# Patient Record
Sex: Female | Born: 1970 | Race: White | Hispanic: No | Smoking: Never smoker
Health system: Southern US, Community
[De-identification: ages and names within clinical notes are randomized; demographics above are authoritative.]

## PROBLEM LIST (undated history)

## (undated) DIAGNOSIS — E7800 Pure hypercholesterolemia, unspecified: Secondary | ICD-10-CM

## (undated) DIAGNOSIS — O24419 Gestational diabetes mellitus in pregnancy, unspecified control: Secondary | ICD-10-CM

## (undated) DIAGNOSIS — L4050 Arthropathic psoriasis, unspecified: Secondary | ICD-10-CM

## (undated) HISTORY — PX: WEIL OSTEOTOMY: SHX5044

## (undated) HISTORY — PX: ABDOMINAL HYSTERECTOMY: SHX81

## (undated) HISTORY — DX: Pure hypercholesterolemia, unspecified: E78.00

## (undated) HISTORY — DX: Arthropathic psoriasis, unspecified: L40.50

---

## 1997-12-24 ENCOUNTER — Emergency Department (HOSPITAL_COMMUNITY): Admission: EM | Admit: 1997-12-24 | Discharge: 1997-12-24 | Payer: Self-pay | Admitting: Emergency Medicine

## 1997-12-24 ENCOUNTER — Encounter: Payer: Self-pay | Admitting: Emergency Medicine

## 1999-05-27 ENCOUNTER — Inpatient Hospital Stay (HOSPITAL_COMMUNITY): Admission: AD | Admit: 1999-05-27 | Discharge: 1999-05-27 | Payer: Self-pay | Admitting: *Deleted

## 1999-07-23 ENCOUNTER — Encounter (HOSPITAL_COMMUNITY): Admission: RE | Admit: 1999-07-23 | Discharge: 1999-08-26 | Payer: Self-pay | Admitting: Obstetrics and Gynecology

## 1999-08-19 ENCOUNTER — Inpatient Hospital Stay (HOSPITAL_COMMUNITY): Admission: AD | Admit: 1999-08-19 | Discharge: 1999-08-19 | Payer: Self-pay | Admitting: *Deleted

## 1999-08-23 ENCOUNTER — Encounter (INDEPENDENT_AMBULATORY_CARE_PROVIDER_SITE_OTHER): Payer: Self-pay

## 1999-08-23 ENCOUNTER — Inpatient Hospital Stay (HOSPITAL_COMMUNITY): Admission: AD | Admit: 1999-08-23 | Discharge: 1999-08-25 | Payer: Self-pay | Admitting: Obstetrics & Gynecology

## 2000-07-30 ENCOUNTER — Inpatient Hospital Stay (HOSPITAL_COMMUNITY): Admission: AD | Admit: 2000-07-30 | Discharge: 2000-07-30 | Payer: Self-pay | Admitting: Obstetrics & Gynecology

## 2000-08-03 ENCOUNTER — Other Ambulatory Visit: Admission: RE | Admit: 2000-08-03 | Discharge: 2000-08-03 | Payer: Self-pay | Admitting: Obstetrics and Gynecology

## 2000-08-21 ENCOUNTER — Observation Stay (HOSPITAL_COMMUNITY): Admission: RE | Admit: 2000-08-21 | Discharge: 2000-08-23 | Payer: Self-pay | Admitting: Obstetrics and Gynecology

## 2000-08-21 ENCOUNTER — Encounter (INDEPENDENT_AMBULATORY_CARE_PROVIDER_SITE_OTHER): Payer: Self-pay

## 2001-05-25 ENCOUNTER — Inpatient Hospital Stay (HOSPITAL_COMMUNITY): Admission: AD | Admit: 2001-05-25 | Discharge: 2001-05-25 | Payer: Self-pay | Admitting: Obstetrics and Gynecology

## 2002-06-03 ENCOUNTER — Inpatient Hospital Stay (HOSPITAL_COMMUNITY): Admission: AD | Admit: 2002-06-03 | Discharge: 2002-06-03 | Payer: Self-pay | Admitting: Family Medicine

## 2003-06-30 ENCOUNTER — Emergency Department (HOSPITAL_COMMUNITY): Admission: EM | Admit: 2003-06-30 | Discharge: 2003-06-30 | Payer: Self-pay | Admitting: Emergency Medicine

## 2004-02-27 ENCOUNTER — Emergency Department (HOSPITAL_COMMUNITY): Admission: EM | Admit: 2004-02-27 | Discharge: 2004-02-27 | Payer: Self-pay | Admitting: Emergency Medicine

## 2004-06-14 ENCOUNTER — Emergency Department (HOSPITAL_COMMUNITY): Admission: EM | Admit: 2004-06-14 | Discharge: 2004-06-14 | Payer: Self-pay | Admitting: Emergency Medicine

## 2005-11-09 ENCOUNTER — Inpatient Hospital Stay (HOSPITAL_COMMUNITY): Admission: AD | Admit: 2005-11-09 | Discharge: 2005-11-09 | Payer: Self-pay | Admitting: Family Medicine

## 2007-09-10 ENCOUNTER — Emergency Department (HOSPITAL_COMMUNITY): Admission: EM | Admit: 2007-09-10 | Discharge: 2007-09-10 | Payer: Self-pay | Admitting: Emergency Medicine

## 2010-05-24 NOTE — Op Note (Signed)
[facility]  Patient:    Kathy Hunt, Kathy Hunt                       MRN: [id number] Proc. Date: 08/23/99 Adm. Date:  [id number] Disc. Date: [id number] Attending:  Leonard Schwartz                           Operative Report  PREOPERATIVE DIAGNOSIS:       Desire for surgical sterilization.  POSTOPERATIVE DIAGNOSIS:      Desire for surgical sterilization.  OPERATION:                    Postpartum tubal sterilization.  ANESTHESIA:                   Epidural which failed and then general orotracheal.  ESTIMATED BLOOD LOSS:         Less than 10 cc.  COMPLICATIONS:                Failed epidural.  FINDINGS:                     The tubes were normal for the postpartum stated.  DESCRIPTION OF PROCEDURE:     A discussion was held with the patient prior to her procedure concerning the indications for her procedure which is her desire for surgical sterilization and no further children as well as the risks involved which include but are not limited to anesthesia, bleeding, infection, damage to adjacent organs, and failed tubal sterilization resulting in subsequent pregnancy.  The patient wished to proceed.  She was taken to the operating room after appropriate identification and placed on the operating table.  Her labor epidural had been redosed in the preoperative area.  She was placed on the operating table in the supine position.  The abdomen was prepped with multiple layers of Betadine and draped as a sterile field.  The subumbilical area was infiltrated with 5 cc of quarter percent Marcaine. After a waiting period of approximately 20-25 minutes, there still was not adequate anesthesia for a surgical procedure, and the decision was made by the anesthesiologist to proceed with a general anesthetic.  This was done, and a subumbilical incision made.  The peritoneal cavity was then entered with a combination of blunt and sharp dissection and the left  fallopian tube identified, followed to imbriated end, then grasped at the isthmic portion.  A suture of 2-0 chromic was placed through the mesosalpinx and tied fore and aft on a knuckle of tube.  A second suture ligature was placed proximal to that and the intervening knuckle of tube excised.  The cut ends were cauterized and a similar procedure carried out on the opposite side.  Hemostasis was noted to be adequate.  The abdominal peritoneum was closed with a pursestring suture of 0 Vicryl.  The fascia was closed with a running suture of 0 Vicryl.  A single suture of 0 Vicryl was placed in the subcutaneous tissue, and a subcuticular suture of 3-0 Vicryl was used to close the skin incision.  A sterile dressing was applied and the patient awakened from general anesthesia and then taken to the recovery room in satisfactory condition having tolerated the procedure well with sponge and instrument counts correct. DD:  08/23/99 TD:  08/25/99 Job: [id number]

## 2010-05-24 NOTE — Discharge Summary (Signed)
[facility]  Patient:    Kathy Hunt, Kathy Hunt Visit Number: [id number] MRN: [id number]          Service Type: GYN Location: [id number] Attending Physician:  Wandalee Ferdinand Dictated by:   Rudy Jew Ashley Royalty, M.D. Admit Date:  08/21/2000 Discharge Date: 08/23/2000                             Discharge Summary  DISCHARGE DIAGNOSIS:          Uterine prolapse.  OPERATIONS AND SPECIAL PROCEDURES:           1. Total vaginal hysterectomy.                               2. Posterior colporrhaphy.  CONSULTATIONS:                None.  DISCHARGE MEDICATIONS:        1. Chromagen one p.o. q.d.                               2. Darvocet-N 100 one p.o. q.3-4h. p.r.n. pain.                               3. Peri-Colace one to two q.d.  HISTORY AND PHYSICAL:         This is a 40 year old, G4, P3, abortus 1, who presented to Dr. Sylvester Harder for evaluation of possible prolapsed uterus. She noted on or about July 24 an abrupt drop in her uterus. Examination in the office revealed a second degree cystocele and first degree rectocele. The overall uterine prolapse, however, was a third degree and quite debilitating to her. In the course of the workup, she was also noted to have a right adnexal cyst on ultrasound. For the remainder of the history and physical, please see chart.  HOSPITAL COURSE:              The patient was admitted to [facility] on August 21, 2000. On that same date, she was taken to the operating room and underwent total vaginal hysterectomy as well as posterior colporrhaphy. The procedure was uncomplicated. Postoperative course was similarly uncomplicated. The patient was discharged on the second postoperative day afebrile and in satisfactory condition.  ACCESSORY CLINICAL FINDINGS:  Pathology report revealed nonspecific chronic cervicitis, degenerating secretory endometrium, myometrium with no pathologic abnormalities,  benign paratubal cyst. CBC was obtained on admission and revealed hemoglobin of 13.6, hematocrit 38.4. Repeat values were obtained on August 22, 2000 and revealed values of 9.5 and 26.6, respectively. Final CBC was obtained August 23, 2000 and revealed values of 9.2 and 26.3, respectively. Comprehensive metabolic panel was obtained and was essentially within normal limits. Type and Rh revealed B negative blood.  DISPOSITION:                  The patient is to return to [facility] in four to six weeks for postoperative evaluation. She is also to return in two to three days postdischarge for preliminary evaluation as well. Dictated by:   Rudy Jew Ashley Royalty, M.D. Attending Physician:  Wandalee Ferdinand DD:  09/10/00 TD:  09/10/00 Job: [id number]

## 2010-05-24 NOTE — H&P (Signed)
[facility]  Patient:    Kathy Hunt, Kathy Hunt                       MRN: [id number] Adm. Date:  [id number] Disc. Date: [id number] Attending:  Antionette Char                         History and Physical  HISTORY OF PRESENT ILLNESS:   This is a 40 year old gravida 4, para 3, abortus 1, who presented to me August 03, 2000, for evaluation of a possible prolapsed uterus.  She stated she noted on or about July 29, 2000, an abrupt drop in her uterus.  She asked her husband to check and stated he said it appeared abnormal.  She, hence, arranged to come to the office.  She denies any stress urinary incontinence.  She denied having to use any digital pressure to effect a bowel movement.  She is status post BTSP.  Also is status post vaginal delivery x 3.  The patient states her symptoms are sufficiently debilitating to warrant surgery.  MEDICATIONS:                  None.  PAST MEDICAL HISTORY:         Eczema.  PAST SURGICAL HISTORY:        1. D&C for SAB.                               2. BTSP.                               3. Vaginal delivery x 3.  ALLERGIES:                    PENICILLIN - RASH.  FAMILY HISTORY:               Hypertension and diabetes.  SOCIAL HISTORY:               The patient denies use of tobacco or significant alcohol.  REVIEW OF SYSTEMS:            Noncontributory.  PHYSICAL EXAMINATION:  GENERAL:                      Well-developed, well-nourished, pleasant white female in no acute distress.  VITAL SIGNS:                  Afebrile, vital signs stable.  SKIN:                         Warm and dry, without lesions.  LYMPH:                        No supraclavicular, cervical, or inguinal adenopathy.  HEENT:                        Normocephalic.  NECK:                         Supple.  Without thyromegaly.  CHEST:                        Clear.  LUNGS:  Clear.  CARDIAC:                      Regular  rate and rhythm.  Without murmurs, gallops, or rubs.  BREASTS:                      Deferred.  ABDOMEN:                      Soft and nontender.  Without masses or organomegaly.  Bowel sounds are active.  MUSCULOSKELETAL:              Full range of motion.  Without edema, cyanosis, or CVA tenderness.  PELVIC:                       External genitalia within normal limits.  Vagina and cervix without gross lesions.  Bimanual examination reveals the uterus to be approximately 10 x 6 x 5 cm.  It is retroverted.  She is noted to have an approximately third-degree prolapse.  There is a second-degree cystocele and a first-degree rectocele.  However, the uterine descensus appears to be out of proportion to the vaginal prolapse.  LABORATORY DATA:              Ultrasound on August 05, 2000, reveals normal-sized uterus with an arcuate pattern of the fundus.  In the right adnexa there is a 1.6 cm irregularly shaped cyst.  The left adnexa is unremarkable.  IMPRESSION:                   1. Uterine prolapse, third-degree, symptomatic                                  and debilitating.                               2. Right adnexal cyst versus cystic follicle.                               3. Second-degree cystocele and first-degree                                  rectocele.  PLAN:                         Total vaginal hysterectomy.  The possibility of anterior or posterior colporrhaphy discussed and accepted.  I told the patient I feel both are unlikely, but the posterior colporrhaphy is particularly unlikely.  The risks, benefits, and alternatives were thoroughly discussed with the patient.  She states she understands and accepts.  Questions invited and answered. DD:  08/20/00 TD:  08/20/00 Job: [id number]

## 2010-05-24 NOTE — Op Note (Signed)
[facility]  Patient:    Kathy Hunt, Kathy Hunt Visit Number: [id number] MRN: [id number]          Service Type: Attending:  Rudy Jew. Ashley Royalty, M.D. Dictated by:   Rudy Jew Ashley Royalty, M.D. Proc. Date: 08/21/00 Adm. Date:  08/23/00                             Operative Report  PREOPERATIVE DIAGNOSES:       1. Uterine prolapse.                               2. Second degree cystocele.                               3. First degree rectocele.  POSTOPERATIVE DIAGNOSES:      1. Uterine prolapse.                               2. Second degree cystocele.                               3. First degree rectocele.  OPERATION:                    1. Total vaginal hysterectomy.                               2. Posterior colporrhaphy.  SURGEON:                      Rudy Jew. Ashley Royalty, M.D.  ASSISTANT:                    Marcelle Overlie, M.D.  ANESTHESIA:                   General.  ESTIMATED BLOOD LOSS:         300 cc.  COMPLICATIONS:                None.  PACKS AND DRAINS:             Foley, Iodoform vaginal pack.  DESCRIPTION OF PROCEDURE:     The patient was taken to the operating room and placed in the dorsal supine position.  After adequate general anesthesia was administered, she was placed in the lithotomy position and prepped and draped in the usual manner for vaginal surgery.  Posterior weighted retractor was placed per vagina, and the apex of the vagina was grasped with a Jacobs tenaculum.  Approximately 20 cc of 1% Xylocaine with 1:100,000 epinephrine was instilled into the cervical tissues to aid in hemostasis.  Foley catheter was placed.  An incision was made from 3 to 9 oclock in the cervical mucosa with a #15 blade.  A posterior colpotomy incision was made, and Steiner-Auvard retractor was placed into the abdominal cavity.  The uterosacral ligaments were bilaterally clamped, cut, and secured with a #1 chromic catgut.  The pedicles were held.  A  second bite was taken on either side in order to accomplish further decensus.  Next, the anterior cul-de-sac was entered uneventfully.  The uterine vessels were clamped, cut, and doubly secured  with a #1 chromic catgut bilaterally.  An additional bite was taken on either side cephalad to the uterine vessels.  The pedicles were clamped, cut, and secured with a #1 chromic catgut.   Next, a single-tooth tenaculum was used to delivery the corpus posteriorly.  A final pedicle consisting of the round ligament, fallopian tube, and utero-ovarian ligament was isolated on either side.  The pedicles were cut, clamped, and doubly secured with #1 chromic catgut.  The pedicles were held for later inspection.  Copious irrigation was accomplished.  Hemostasis was noted.  Next, a running locking suture of 0 Vicryl was placed from approximately 2 oclock to 10 oclock on the posterior vaginal cuff using a running locking technique.  Next, a modified McCalls suture of 2-0 Vicryl was placed in the posterior peritoneum just cephalad to the vaginal cuff in order to diminish the chance of enterocele formation and to aid in plication of the uterosacral ligaments up high.  Next, the vaginal mucosa was closed anteriorly to posteriorly using #1 chromic catgut.  The vaginal hysterectomy portion of the procedure was then terminated.  Attention was then turned to the posterior colporrhaphy.  A transverse incision was made in the posterior vagina at the level of the introitus.  The vaginal mucosa was incised longitudinally toward the apex.  The vaginal mucosa was dissected free from the underlying rectal fascia using sharp and blunt dissection.  The redundant rectal tissues were imbricated using 2-0 Vicryl suture.  The excess vaginal mucosa was excised.  The vaginal edges were then reapproximated using 2-0 Vicryl in a running locking fashion.  This seemed to completely repair the rectocele.  Interestingly, after the  hysterectomy was completed, the patient had very little cystocele present, and no repair was felt warranted.  Iodoform vaginal pack was placed and the procedure terminated.  The patient tolerated the procedure extremely well and was returned to the recovery room in good condition. Dictated by:   Rudy Jew Ashley Royalty, M.D. Attending:  Rudy Jew Ashley Royalty, M.D. DD:  08/26/00 TD:  08/27/00 Job: [id number]

## 2010-10-09 LAB — GLUCOSE, CAPILLARY: Glucose-Capillary: 76

## 2010-10-09 LAB — CBC
Platelets: 256
RDW: 12.2

## 2010-10-09 LAB — POCT I-STAT, CHEM 8
Calcium, Ion: 1.14
Glucose, Bld: 89
HCT: 37
Hemoglobin: 12.6

## 2014-01-13 ENCOUNTER — Emergency Department (HOSPITAL_COMMUNITY)
Admission: EM | Admit: 2014-01-13 | Discharge: 2014-01-13 | Disposition: A | Discharge status: Home / Self Care | Payer: Self-pay | Attending: Emergency Medicine | Admitting: Emergency Medicine

## 2014-01-13 ENCOUNTER — Emergency Department (HOSPITAL_COMMUNITY): Payer: Self-pay

## 2014-01-13 ENCOUNTER — Encounter (HOSPITAL_COMMUNITY): Payer: Self-pay | Admitting: *Deleted

## 2014-01-13 DIAGNOSIS — S20212A Contusion of left front wall of thorax, initial encounter: Secondary | ICD-10-CM | POA: Insufficient documentation

## 2014-01-13 DIAGNOSIS — Y998 Other external cause status: Secondary | ICD-10-CM | POA: Insufficient documentation

## 2014-01-13 DIAGNOSIS — S4392XA Sprain of unspecified parts of left shoulder girdle, initial encounter: Secondary | ICD-10-CM | POA: Insufficient documentation

## 2014-01-13 DIAGNOSIS — S43402A Unspecified sprain of left shoulder joint, initial encounter: Secondary | ICD-10-CM

## 2014-01-13 DIAGNOSIS — Y92410 Unspecified street and highway as the place of occurrence of the external cause: Secondary | ICD-10-CM | POA: Insufficient documentation

## 2014-01-13 DIAGNOSIS — Z8632 Personal history of gestational diabetes: Secondary | ICD-10-CM | POA: Insufficient documentation

## 2014-01-13 DIAGNOSIS — Y9389 Activity, other specified: Secondary | ICD-10-CM | POA: Insufficient documentation

## 2014-01-13 DIAGNOSIS — Z880 Allergy status to penicillin: Secondary | ICD-10-CM | POA: Insufficient documentation

## 2014-01-13 HISTORY — DX: Gestational diabetes mellitus in pregnancy, unspecified control: O24.419

## 2014-01-13 MED ORDER — OXYCODONE-ACETAMINOPHEN 5-325 MG PO TABS: ORAL_TABLET | ORAL | Status: DC

## 2014-01-13 MED ORDER — ONDANSETRON 4 MG PO TBDP
4.0000 mg | ORAL_TABLET | Freq: Once | ORAL | Status: AC
  Administered 2014-01-13: 4 mg via ORAL
  Filled 2014-01-13: qty 1

## 2014-01-13 MED ORDER — OXYCODONE-ACETAMINOPHEN 5-325 MG PO TABS
1.0000 | ORAL_TABLET | Freq: Once | ORAL | Status: AC
  Administered 2014-01-13: 1 via ORAL
  Filled 2014-01-13: qty 1

## 2014-01-13 NOTE — ED Notes (Signed)
L arm sling applied to L shoulder

## 2014-01-13 NOTE — Discharge Instructions (Signed)
Take percocet for breakthrough pain, do not drink alcohol, drive, care for children or do other critical tasks while taking percocet.  Only use the arm sling for up to 2 days. Take the arm out and rotate the shoulder every 4 hours.   It is very important that you take deep breaths to prevent lung collapse and infection.  Take 10 deep breaths every hour to prevent lung collapse.  If you develop cough, fever or shortness of breath return immediately to the emergency room.  Do not hesitate to return to the emergency room for any new, worsening or concerning symptoms.  Please obtain primary care using resource guide below. But the minute you were seen in the emergency room and that they will need to obtain records for further outpatient management.    Emergency Department Resource Guide 1) Find a Doctor and Pay Out of Pocket Although you won't have to find out who is covered by your insurance plan, it is a good idea to ask around and get recommendations. You will then need to call the office and see if the doctor you have chosen will accept you as a new patient and what types of options they offer for patients who are self-pay. Some doctors offer discounts or will set up payment plans for their patients who do not have insurance, but you will need to ask so you aren't surprised when you get to your appointment.  2) Contact Your Local Health Department Not all health departments have doctors that can see patients for sick visits, but many do, so it is worth a call to see if yours does. If you don't know where your local health department is, you can check in your phone book. The CDC also has a tool to help you locate your state's health department, and many state websites also have listings of all of their local health departments.  3) Find a [facility] If your illness is not likely to be very severe or complicated, you may want to try a walk in clinic. These are popping up all over the  country in pharmacies, drugstores, and shopping centers. They're usually staffed by nurse practitioners or physician assistants that have been trained to treat common illnesses and complaints. They're usually fairly quick and inexpensive. However, if you have serious medical issues or chronic medical problems, these are probably not your best option.  No Primary Care Doctor: - Call Health Connect at  [phone] - they can help you locate a primary care doctor that  accepts your insurance, provides certain services, etc. - Physician Referral Service- [phone]  Chronic Pain Problems: Organization         Address  Phone   Notes  [facility]  [phone] Patients need to be referred by their primary care doctor.   Medication Assistance: Organization         Address  Phone   Notes  [facility] Medication [facility] [address] [place], [place] [address] [phone] --Must be a resident of [facility] -- Must have NO insurance coverage whatsoever (no Medicaid/ Medicare, etc.) -- The pt. MUST have a primary care doctor that directs their care regularly and follows them in the community   Med[facility]ist  [phone]   [employer]  [phone]    Agencies that provide inexpensive medical care: Organization         Address  Phone   Notes  [place]  (  [phone]   [facility]    [phone]   [facility] [place], [place] [address] [phone]   [place] [address], [place] [phone]   Planned Parenthood    [phone]   [facility]    [phone]   [place] and [address], [place] Phone:  [phone], Fax:  [phone] Hours of Operation:  9 am - 6 pm, M-F.  Also accepts Medicaid/Medicare and self-pay.  [facility] for [place] [address], [address], [place] Phone: [phone], Fax: [phone]. Hours of Operation:  8:30 am - 5:30 pm, M-F.  Also accepts Medicaid and self-pay.  [facility] [place] [address] [place] Phone: [phone]   [place], [place], [place] [phone], Ext. [phone] Mondays & Thursdays: 7-9 AM.  First 15 patients are seen on a first come, first serve basis.    [place] Providers:  Organization         Address  Phone   Notes  [facility] [address] [place], [place] [phone] Also accepts self-pay patients.  [facility] [address], [place]  [phone]   [place], [address], [place] [phone]   [facility] [address] [place] [phone]   Lucianne Lei [address], [place]   [phone] Only accepts [place] Access [place] patients after they have their name applied to their card.   Self-Pay (no insurance) in [facility]:  Organization         Address  Phone   Notes  Sickle Cell Patients, [facility] [place] [phone]   [facility] [place] [phone]   [facility] [place]  [place], [place], [place] [phone]   [facility]/Dr. Osei-Bonsu  [address], [place] or [address], [place] [phone] Phone number for both [place] and [place] locations is the same.  [facility] [address], [place] [phone]   [facility] [place] [address] [place] or [address] [phone]   [facility] [address] [place] [phone], phone; [phone], fax Sees patients 1st and 3rd Saturday of every month.  Must  not qualify for public or private insurance (i.e. Medicaid, Medicare, [place] Health Choice, Veterans' Benefits)  Household income should be no more than 200% of the poverty level The clinic cannot treat you if you are pregnant or think you are pregnant  Sexually transmitted diseases are not treated at the clinic.    Dental Care: Organization         Address  Phone  Notes  [facility] Department of [facility] [place] [phone] Accepts children up to age 78 who are enrolled in [place] or [place]; pregnant women with a Medicaid card; and children who have applied for Medicaid or [place] Health Choice, but were declined, whose parents can pay a reduced fee at time of service.  [place]  [address], [employer] [phone] Accepts children up to age 34 who are enrolled in Medicaid or [place]; pregnant women with a Medicaid card; and children who have applied for Medicaid or  Health Choice, but were declined, whose parents can pay a reduced fee at time of service.  [facility] Access [facility]  University [phone] Patients are seen by appointment only. Walk-ins are not accepted. [place] will see patients 52 years of age and older. Monday - Tuesday (8am-5pm) Most Wednesdays (8:30-5pm) $30 per visit, cash only  [facility] Adult [facility] Access PROGRAM  [address], [facility] [phone] Patients are seen by appointment only. Walk-ins are not accepted. [place] will see patients 15 years of age and older. One Wednesday Evening (Monthly: Volunteer Based).  $30 per visit, cash only  [place]  [phone] for adults; Children under age 68, call Graduate Pediatric Dentistry at [phone]. Children aged 14-14, please call [phone] to request a pediatric application.  Dental services are  provided in all areas of dental care including fillings, crowns and bridges, complete and partial dentures, implants, gum treatment, root canals, and extractions. Preventive care is also provided. Treatment is provided to both adults and children. Patients are selected via a lottery and there is often a waiting list.   [facility] [address] [place]  [phone] [email or web address]   [facility] [address] [place], [place] [phone], Ext. [phone] Second and Fourth Thursday of each month, opens at 6:30 AM; Clinic ends at 9 AM.  Patients are seen on a first-come first-served basis, and a limited number are seen during each clinic.   [facility]  [address] [place], [place] [phone]   Eligibility Requirements You must have lived in [place], [place], or [place] counties for at least the last three months.   You cannot be eligible for state or federal sponsored [employer], including [employer], [place], or [employer].   You generally cannot be eligible for healthcare insurance through your employer.    How to apply: Eligibility screenings are held every Tuesday and Wednesday afternoon from 1:00 pm until 4:00 pm. You do not need an appointment for the interview!  [facility] [address], [place], [place]   [place]  [place] Department  [place]  [phone]    Behavioral Health Resources in the Community: Intensive Outpatient Programs Organization         Address  Phone  Notes  [place]. [address] [place], [place] [phone]   [facility] Outpatient [address], [place], [place]   ADS: Alcohol & Drug Svcs [address], [place], [place]   [place] [address]  [place], [place] or [phone]   Substance Abuse Resources Organization         Address  Phone  Notes  Alcohol and Drug Services  [phone]   [place]  [phone]   [facility]   Chinita Pester  [phone]   Residential & Outpatient Substance Abuse Program  [phone]   Psychological Services Organization         Address  Phone  Notes  [place]  [phone]  [place] [address] [place] or [phone]    Mobile Crisis Teams Organization         Address  Phone  Notes  [facility], Mobile Crisis Care Unit  [phone]   [facility]  [address] [place], [place]   [place] [address] [place] [phone]    Self-Help/Support Groups Organization         Address  Phone             Notes  [place]. of [place] - variety of support groups  [place] Call for more information  Narcotics Anonymous (NA), [facility] [address], [employer] [place]  2 meetings at this location   Special educational needs teacher         Address  Phone  Notes  [facility]idential [facility] [place],    [place]  [phone]   [facility]  [address], [place] [address]2831, [place], [place]   [place], [place] [phone] Admissions: 8am-3pm M-F  [facility] [address]    [place], [place] [phone]   [facility] [address] [place], [place], [place]   [facility] [address]  [place], [place]   [facility] - Intensive Outpatient [address], [place], [place]   [facility] [place].,  [place], [place] [phone] or  [phone]   [facility] Services (RT[facility]) [address], [place], [place] Accepts Medicaid  [facility] [place] [address],  [place] [phone] Substance Abuse/Addiction Treatment   [facility] Organization         Address  Phone  Notes  [facility]  [phone]   Domenic Schwab, PhD [address] [place], [place]   [phone] or [phone]   [place], [place] [phone]   [facility] [address], [place], [place] [phone] Insurance/Medicaid/sponsorship through [facility] and Families [address], [address] [place]                                    [place], [place] [phone] [place] [address], [place] [phone]    Dr. Adele Schilder  [phone]   [facility] Dept. 1) [address] [place] 2) [place] 3)  [place] [address], [place] [phone]  [phone]   [place] [phone] or [phone] (After Hours)
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
## 2014-01-13 NOTE — ED Notes (Signed)
KED removed by Elmyra Ricks PA

## 2014-01-13 NOTE — ED Notes (Signed)
Pt ambulatory to restroom with steady gait. Pt reports nausea. PA aware

## 2015-01-03 ENCOUNTER — Emergency Department (HOSPITAL_COMMUNITY): Payer: Self-pay

## 2015-01-03 ENCOUNTER — Encounter (HOSPITAL_COMMUNITY): Payer: Self-pay | Admitting: *Deleted

## 2015-01-03 ENCOUNTER — Emergency Department (HOSPITAL_COMMUNITY)
Admission: EM | Admit: 2015-01-03 | Discharge: 2015-01-03 | Disposition: A | Discharge status: Home / Self Care | Payer: Self-pay | Attending: Emergency Medicine | Admitting: Emergency Medicine

## 2015-01-03 DIAGNOSIS — S93402A Sprain of unspecified ligament of left ankle, initial encounter: Secondary | ICD-10-CM | POA: Insufficient documentation

## 2015-01-03 DIAGNOSIS — S8262XA Displaced fracture of lateral malleolus of left fibula, initial encounter for closed fracture: Secondary | ICD-10-CM | POA: Insufficient documentation

## 2015-01-03 DIAGNOSIS — R42 Dizziness and giddiness: Secondary | ICD-10-CM | POA: Insufficient documentation

## 2015-01-03 DIAGNOSIS — Y998 Other external cause status: Secondary | ICD-10-CM | POA: Insufficient documentation

## 2015-01-03 DIAGNOSIS — Z880 Allergy status to penicillin: Secondary | ICD-10-CM | POA: Insufficient documentation

## 2015-01-03 DIAGNOSIS — Y9289 Other specified places as the place of occurrence of the external cause: Secondary | ICD-10-CM | POA: Insufficient documentation

## 2015-01-03 DIAGNOSIS — W108XXA Fall (on) (from) other stairs and steps, initial encounter: Secondary | ICD-10-CM | POA: Insufficient documentation

## 2015-01-03 DIAGNOSIS — Y9389 Activity, other specified: Secondary | ICD-10-CM | POA: Insufficient documentation

## 2015-01-03 MED ORDER — IBUPROFEN 400 MG PO TABS
800.0000 mg | ORAL_TABLET | Freq: Once | ORAL | Status: AC
  Administered 2015-01-03: 800 mg via ORAL
  Filled 2015-01-03: qty 2

## 2015-01-03 MED ORDER — IBUPROFEN 600 MG PO TABS: 600.0000 mg | ORAL_TABLET | Freq: Four times a day (QID) | ORAL | Status: DC | PRN
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
## 2015-01-03 NOTE — Discharge Instructions (Signed)
Take your medications as prescribed. Follow-up with your doctor as needed. Return to ED for any new or worsening symptoms as we discussed.  Ankle Sprain An ankle sprain is an injury to the strong, fibrous tissues (ligaments) that hold the bones of your ankle joint together.  CAUSES An ankle sprain is usually caused by a fall or by twisting your ankle. Ankle sprains most commonly occur when you step on the outer edge of your foot, and your ankle turns inward. People who participate in sports are more prone to these types of injuries.  SYMPTOMS   Pain in your ankle. The pain may be present at rest or only when you are trying to stand or walk.  Swelling.  Bruising. Bruising may develop immediately or within 1 to 2 days after your injury.  Difficulty standing or walking, particularly when turning corners or changing directions. DIAGNOSIS  Your caregiver will ask you details about your injury and perform a physical exam of your ankle to determine if you have an ankle sprain. During the physical exam, your caregiver will press on and apply pressure to specific areas of your foot and ankle. Your caregiver will try to move your ankle in certain ways. An X-ray exam may be done to be sure a bone was not broken or a ligament did not separate from one of the bones in your ankle (avulsion fracture).  TREATMENT  Certain types of braces can help stabilize your ankle. Your caregiver can make a recommendation for this. Your caregiver may recommend the use of medicine for pain. If your sprain is severe, your caregiver may refer you to a surgeon who helps to restore function to parts of your skeletal system (orthopedist) or a physical therapist. [place] ice to your injury for 1-2 days or as directed by your caregiver. Applying ice helps to reduce inflammation and pain.  Put ice in a plastic bag.  Place a towel between your skin and the bag.  Leave the ice on for 15-20 minutes at a  time, every 2 hours while you are awake.  Only take over-the-counter or prescription medicines for pain, discomfort, or fever as directed by your caregiver.  Elevate your injured ankle above the level of your heart as much as possible for 2-3 days.  If your caregiver recommends crutches, use them as instructed. Gradually put weight on the affected ankle. Continue to use crutches or a cane until you can walk without feeling pain in your ankle.  If you have a plaster splint, wear the splint as directed by your caregiver. Do not rest it on anything harder than a pillow for the first 24 hours. Do not put weight on it. Do not get it wet. You may take it off to take a shower or bath.  You may have been given an elastic bandage to wear around your ankle to provide support. If the elastic bandage is too tight (you have numbness or tingling in your foot or your foot becomes cold and blue), adjust the bandage to make it comfortable.  If you have an air splint, you may blow more air into it or let air out to make it more comfortable. You may take your splint off at night and before taking a shower or bath. Wiggle your toes in the splint several times per day to decrease swelling. SEEK MEDICAL CARE IF:   You have rapidly increasing bruising or swelling.  Your toes feel extremely cold or you lose  feeling in your foot.  Your pain is not relieved with medicine. SEEK IMMEDIATE MEDICAL CARE IF:  Your toes are numb or blue.  You have severe pain that is increasing. MAKE SURE YOU:   Understand these instructions.  Will watch your condition.  Will get help right away if you are not doing well or get worse.   This information is not intended to replace advice given to you by your health care provider. Make sure you discuss any questions you have with your health care provider.   Document Released: 12/23/2004 Document Revised: 01/13/2014 Document Reviewed: 01/04/2011 Elsevier Interactive Patient  Education [employer].

## 2015-01-03 NOTE — ED Notes (Signed)
See PA-C assessment for SecondARY

## 2015-01-03 NOTE — ED Provider Notes (Signed)
CSN: [id number]     Arrival date & time 01/03/15  2210 History  By signing my name below, I, Kathy Hunt, attest that this documentation has been prepared under the direction and in the presence of [employer], PA-C. Electronically Signed: Irene Hunt, ED Scribe. 01/03/2015. 11:32 PM.   Chief Complaint  Patient presents with  . Ankle Pain   The history is provided by the patient. No language interpreter was used.  HPI Comments: Kathy Hunt is a 44 y.o. female who presents to the Emergency Department complaining of a fall onset PTA. Pt reports she was helping a client carry items into their house when she lost her balance and fell down two steps, landing on her back and hitting her head. She reports associated abrasion to the back, lightheadedness, nausea, and left ankle pain with swelling. She reports worsening pain with ambulation. She reports hx of fractured left ankle x2. Pt reports taking tylenol to no relief. She denies numbness, weakness, or LOC.    Past Medical History  Diagnosis Date  . Gestational diabetes    Past Surgical History  Procedure Laterality Date  . Abdominal hysterectomy     No family history on file. Social History  Substance Use Topics  . Smoking status: Never Smoker   . Smokeless tobacco: None  . Alcohol Use: No   OB History    No data available     Review of Systems  Gastrointestinal: Positive for nausea.  Musculoskeletal: Positive for arthralgias.  Neurological: Positive for light-headedness. Negative for syncope, weakness and numbness.  All other systems reviewed and are negative.   Allergies  Penicillins  Home Medications   Prior to Admission medications   Medication Sig Start Date End Date Taking? Authorizing Provider  ibuprofen (ADVIL,MOTRIN) 600 MG tablet Take 1 tablet (600 mg total) by mouth every 6 (six) hours as needed. 01/03/15   Comer Locket, PA-C  oxyCODONE-acetaminophen (PERCOCET/ROXICET) 5-325 MG per tablet 1 to 2  tabs PO q6hrs  PRN for pain Patient not taking: Reported on 01/03/2015 01/13/14   Elmyra Ricks Pisciotta, PA-C   BP 118/73 mmHg  Pulse 85  Temp(Src) 98.2 F (36.8 C) (Oral)  Resp 18  Ht 5\' 2"  (1.575 m)  Wt 58.968 kg  BMI 23.77 kg/m2  SpO2 100% Physical Exam  Constitutional: She is oriented to person, place, and time. She appears well-developed and well-nourished.  HENT:  Head: Normocephalic.  Eyes: EOM are normal.  Neck: Normal range of motion. Neck supple.  Cardiovascular: Normal rate, regular rhythm and normal heart sounds.   Pulmonary/Chest: Effort normal and breath sounds normal.  Abdominal: Soft. There is no tenderness.  Musculoskeletal: Normal range of motion.  Full active ROM of left knee; no tenderness to fibular head; diffuse swelling noted to the left ankle; tenderness to posterior aspect of left lateral malleolus; brisk cap refill; distal pulses intact; sensation intact to light touch  Neurological: She is alert and oriented to person, place, and time.  Psychiatric: She has a normal mood and affect.  Nursing note and vitals reviewed.   ED Course  Procedures (including critical care time) DIAGNOSTIC STUDIES: Oxygen Saturation is 100% on RA, normal by my interpretation.    COORDINATION OF CARE: 11:15 PM-Discussed treatment plan which includes x-ray with pt at bedside and pt agreed to plan.    Labs Review Labs Reviewed - No data to display  Imaging Review Dg Ankle Complete Left  01/03/2015  CLINICAL DATA:  Left ankle pain and swelling after fall  EXAM: LEFT ANKLE COMPLETE - 3+ VIEW COMPARISON:  None. FINDINGS: There is prominent lateral left ankle soft tissue swelling. No acute fracture, subluxation or suspicious focal osseous lesion. Tiny well corticated osseous fragments adjacent to the tips of the medial and lateral malleolar I are likely developmental ossicles or the sequela of remote injury. Small plantar left calcaneal spur. IMPRESSION: Prominent lateral left ankle  soft tissue swelling, with no fracture or malalignment. Electronically Signed   By: Ilona Sorrel M.D.   On: 01/03/2015 23:28   I have personally reviewed and evaluated these images and lab results as part of my medical decision-making. SPLINT APPLICATION Date/Time: [id number] AM Authorized by: Verl Dicker Consent: Verbal consent obtained. Risks and benefits: risks, benefits and alternatives were discussed Consent given by: patient Splint applied by: orthopedic technician Location details: L ankle Splint type: ASO Supplies used: ASO Post-procedure: The splinted body part was neurovascularly unchanged following the procedure. Patient tolerance: Patient tolerated the procedure well with no immediate complications.      EKG Interpretation None     Medications  ibuprofen (ADVIL,MOTRIN) tablet 800 mg (800 mg Oral Given 01/03/15 2352)   Filed Vitals:   01/03/15 2237 01/03/15 2353  BP: 115/71 118/73  Pulse: 98 85  Temp: 98.8 F (37.1 C) 98.2 F (36.8 C)  Resp: 18     MDM  Patient presents for evaluation of left ankle pain. Patient X-Ray negative for obvious fracture or dislocation. Pain managed in ED. neurovascularly intact. Pt advised to follow up with orthopedics if symptoms persist for possibility of missed fracture diagnosis. Patient given brace while in ED, conservative therapy recommended and discussed. Patient will be dc home & is agreeable with above plan.  Final diagnoses:  Left ankle sprain, initial encounter    I personally performed the services described in this documentation, which was scribed in my presence. The recorded information has been reviewed and is accurate.    Comer Locket, PA-C 01/04/15 0133  Tanna Furry, MD 01/12/15 [phone]

## 2016-09-13 ENCOUNTER — Encounter (HOSPITAL_COMMUNITY): Payer: Self-pay

## 2016-09-13 ENCOUNTER — Emergency Department (HOSPITAL_COMMUNITY): Payer: Medicaid Other

## 2016-09-13 ENCOUNTER — Emergency Department (HOSPITAL_COMMUNITY)
Admission: EM | Admit: 2016-09-13 | Discharge: 2016-09-14 | Disposition: A | Discharge status: Home / Self Care | Payer: Medicaid Other | Attending: Emergency Medicine | Admitting: Emergency Medicine

## 2016-09-13 DIAGNOSIS — R1084 Generalized abdominal pain: Secondary | ICD-10-CM | POA: Insufficient documentation

## 2016-09-13 DIAGNOSIS — R109 Unspecified abdominal pain: Secondary | ICD-10-CM

## 2016-09-13 LAB — CBC
HEMATOCRIT: 37.9 % (ref 36.0–46.0)
Hemoglobin: 13.1 g/dL (ref 12.0–15.0)
MCH: 32.5 pg (ref 26.0–34.0)
MCHC: 34.6 g/dL (ref 30.0–36.0)
MCV: 94 fL (ref 78.0–100.0)
Platelets: 273 10*3/uL (ref 150–400)
RBC: 4.03 MIL/uL (ref 3.87–5.11)
RDW: 12.7 % (ref 11.5–15.5)
WBC: 9.3 10*3/uL (ref 4.0–10.5)

## 2016-09-13 LAB — COMPREHENSIVE METABOLIC PANEL
ALBUMIN: 4.2 g/dL (ref 3.5–5.0)
ALT: 14 U/L (ref 14–54)
AST: 14 U/L — AB (ref 15–41)
Alkaline Phosphatase: 55 U/L (ref 38–126)
Anion gap: 10 (ref 5–15)
BUN: 5 mg/dL — ABNORMAL LOW (ref 6–20)
CHLORIDE: 106 mmol/L (ref 101–111)
CO2: 21 mmol/L — ABNORMAL LOW (ref 22–32)
Calcium: 10.2 mg/dL (ref 8.9–10.3)
Creatinine, Ser: 0.62 mg/dL (ref 0.44–1.00)
GFR calc Af Amer: >60 mL/min (ref >60)
GFR calc non Af Amer: >60 mL/min (ref >60)
Glucose, Bld: 95 mg/dL (ref 65–99)
POTASSIUM: 3.7 mmol/L (ref 3.5–5.1)
Sodium: 137 mmol/L (ref 135–145)
Total Bilirubin: 1.1 mg/dL (ref 0.3–1.2)
Total Protein: 7.5 g/dL (ref 6.5–8.1)

## 2016-09-13 LAB — URINALYSIS, ROUTINE W REFLEX MICROSCOPIC
Bilirubin Urine: NEGATIVE
Glucose, UA: NEGATIVE mg/dL
Ketones, ur: NEGATIVE mg/dL
Leukocytes, UA: NEGATIVE
Nitrite: NEGATIVE
Protein, ur: NEGATIVE mg/dL
SPECIFIC GRAVITY, URINE: 1.01 (ref 1.005–1.030)
SQUAMOUS EPITHELIAL / LPF: NONE SEEN
pH: 5 (ref 5.0–8.0)

## 2016-09-13 LAB — LIPASE, BLOOD: LIPASE: 37 U/L (ref 11–51)

## 2016-09-13 MED ORDER — OXYCODONE-ACETAMINOPHEN 5-325 MG PO TABS
2.0000 | ORAL_TABLET | Freq: Once | ORAL | Status: AC
  Administered 2016-09-13: 2 via ORAL
  Filled 2016-09-13: qty 2

## 2016-09-13 MED ORDER — KETOROLAC TROMETHAMINE 30 MG/ML IJ SOLN
30.0000 mg | Freq: Once | INTRAMUSCULAR | Status: AC
  Administered 2016-09-13: 30 mg via INTRAVENOUS
  Filled 2016-09-13: qty 1

## 2016-09-13 MED ORDER — IOPAMIDOL (ISOVUE-370) INJECTION 76%
INTRAVENOUS | Status: AC
  Administered 2016-09-13: 100 mL via INTRAVENOUS
  Filled 2016-09-13: qty 100

## 2016-09-13 MED ORDER — FENTANYL CITRATE (PF) 100 MCG/2ML IJ SOLN
100.0000 ug | Freq: Once | INTRAMUSCULAR | Status: AC
  Administered 2016-09-13: 100 ug via INTRAVENOUS
  Filled 2016-09-13: qty 2

## 2016-09-13 MED ORDER — SODIUM CHLORIDE 0.9 % IV BOLUS (SEPSIS)
1000.0000 mL | Freq: Once | INTRAVENOUS | Status: AC
  Administered 2016-09-13: 1000 mL via INTRAVENOUS

## 2016-09-13 MED ORDER — ONDANSETRON HCL 4 MG/2ML IJ SOLN
4.0000 mg | Freq: Once | INTRAMUSCULAR | Status: AC
  Administered 2016-09-13: 4 mg via INTRAVENOUS
  Filled 2016-09-13: qty 2

## 2016-09-13 NOTE — ED Notes (Signed)
Pt to CT at this time.

## 2016-09-13 NOTE — ED Triage Notes (Signed)
Per Pt, Pt is coming from home with complaints of right mid back pain that radiates into her abdomen. Pt reports it starting yesterday with increase of pain. Pt has some nausea, but no vomiting or diarrhea.

## 2016-09-13 NOTE — ED Notes (Signed)
Pt returned from CT. Family at bedside

## 2016-09-14 MED ORDER — IBUPROFEN 800 MG PO TABS: 800.0000 mg | ORAL_TABLET | Freq: Three times a day (TID) | ORAL | 0 refills | Status: DC | PRN

## 2016-09-14 NOTE — ED Notes (Signed)
Dr. Dayna Barker in talking with pt

## 2016-09-14 NOTE — ED Provider Notes (Signed)
[facility] DEPT Provider Note   CSN: [id number] Arrival date & time: 09/13/16  1528     History   Chief Complaint Chief Complaint  Patient presents with  . Flank Pain  . Abdominal Pain    HPI Kathy Hunt is a 46 y.o. female.   Flank Pain  This is a new problem. The current episode started 1 to 2 hours ago. The problem occurs constantly. The problem has not changed since onset.Associated symptoms include abdominal pain. Nothing aggravates the symptoms. Nothing relieves the symptoms.  Abdominal Pain   Associated symptoms include hematuria.    Past Medical History:  Diagnosis Date  . Gestational diabetes     There are no active problems to display for this patient.   Past Surgical History:  Procedure Laterality Date  . ABDOMINAL HYSTERECTOMY      OB History    No data available       Home Medications    Prior to Admission medications   Medication Sig Start Date End Date Taking? Authorizing Provider  ibuprofen (ADVIL,MOTRIN) 800 MG tablet Take 1 tablet (800 mg total) by mouth every 8 (eight) hours as needed. 09/14/16   Samani Deal, Corene Cornea, MD    Family History History reviewed. No pertinent family history.  Social History Social History  Substance Use Topics  . Smoking status: Never Smoker  . Smokeless tobacco: Not on file  . Alcohol use No     Allergies   Penicillins   Review of Systems Review of Systems  Gastrointestinal: Positive for abdominal pain.  Genitourinary: Positive for flank pain and hematuria.  All other systems reviewed and are negative.    Physical Exam Updated Vital Signs BP 111/77   Pulse 62   Temp 98.5 F (36.9 C) (Oral)   Resp (!) 22   Ht 5\' 1"  (1.549 m)   Wt 63.5 kg (140 lb)   SpO2 100%   BMI 26.45 kg/m   Physical Exam  Constitutional: She is oriented to person, place, and time. She appears well-developed and well-nourished. She appears distressed (with pain).  HENT:  Head: Normocephalic and atraumatic.  Eyes:  Conjunctivae are normal.  Neck: Normal range of motion.  Cardiovascular: Normal rate and regular rhythm.   Pulmonary/Chest: No stridor. No respiratory distress.  Abdominal: Soft. She exhibits no distension.  Neurological: She is alert and oriented to person, place, and time. No cranial nerve deficit. Coordination normal.  Skin: Skin is warm and dry.  Nursing note and vitals reviewed.    ED Treatments / Results  Labs (all labs ordered are listed, but only abnormal results are displayed) Labs Reviewed  COMPREHENSIVE METABOLIC PANEL - Abnormal; Notable for the following:       Result Value   CO2 21 (*)    BUN 5 (*)    AST 14 (*)    All other components within normal limits  URINALYSIS, ROUTINE W REFLEX MICROSCOPIC - Abnormal; Notable for the following:    Color, Urine STRAW (*)    Hgb urine dipstick MODERATE (*)    Bacteria, UA RARE (*)    All other components within normal limits  LIPASE, BLOOD  CBC    EKG  EKG Interpretation None       Radiology Ct Renal Stone Study  Result Date: 09/13/2016 CLINICAL DATA:  46 year old female with complaint of right mid back pain radiating into the abdomen since yesterday, increasing, with some associated nausea. EXAM: CT ABDOMEN AND PELVIS WITHOUT CONTRAST TECHNIQUE: Multidetector CT imaging of  the abdomen and pelvis was performed following the standard protocol without IV contrast. COMPARISON:  None. FINDINGS: Lower chest: Unremarkable. Hepatobiliary: No definite cystic or solid hepatic lesions are identified on today's noncontrast CT examination. Unenhanced appearance of the gallbladder is normal. Pancreas: No pancreatic mass or peripancreatic inflammatory changes are noted on today's noncontrast CT examination. Spleen: Unremarkable. Adrenals/Urinary Tract: There are no abnormal calcifications within the collecting system of either kidney, along the course of either ureter, or within the lumen of the urinary bladder. No hydroureteronephrosis  or perinephric stranding to suggest urinary tract obstruction at this time. The unenhanced appearance of the kidneys is unremarkable bilaterally. Unenhanced appearance of the urinary bladder is normal. Bilateral adrenal glands are normal in appearance. Stomach/Bowel: The appearance of the stomach is normal. There is no pathologic dilatation of small bowel or colon. Normal appendix. Vascular/Lymphatic: Aortic atherosclerosis (mild), without evidence of aneurysm. No lymphadenopathy noted in the abdomen or pelvis. Reproductive: Status post hysterectomy. Left ovary is not confidently identified may be surgically absent or atrophic. In the right ovary there is a 2.8 cm low-attenuation lesion, which is incompletely characterized on today's noncontrast CT examination but likely represents a dominant follicle. Other: No significant volume of ascites.  No pneumoperitoneum. Musculoskeletal: There are no aggressive appearing lytic or blastic lesions noted in the visualized portions of the skeleton. IMPRESSION: 1. No acute findings are noted in the abdomen or pelvis to account for the patient's symptoms. Specifically, no urinary tract calculi or findings of urinary tract obstruction are noted at this time. 2. Normal appendix. 3. Aortic atherosclerosis. 4. Additional incidental findings, as above. Electronically Signed   By: Vinnie Langton M.D.   On: 09/13/2016 21:47   Ct Angio Abd/pel W And/or Wo Contrast  Result Date: 09/13/2016 CLINICAL DATA:  Bilateral flank pain beginning last night. Nausea. Negative renal stone study. Clinical concern for renal infarct. EXAM: CTA ABDOMEN AND PELVIS wITHOUT AND WITH CONTRAST TECHNIQUE: Multidetector CT imaging of the abdomen and pelvis was performed using the standard protocol during bolus administration of intravenous contrast. Multiplanar reconstructed images and MIPs were obtained and reviewed to evaluate the vascular anatomy. CONTRAST:  100 mL Isovue 370 COMPARISON:  Noncontrast CT  abdomen and pelvis 09/13/2016 FINDINGS: VASCULAR Aorta: Normal caliber aorta without aneurysm, dissection, vasculitis or significant stenosis. Celiac: Patent without evidence of aneurysm, dissection, vasculitis or significant stenosis. SMA: Patent without evidence of aneurysm, dissection, vasculitis or significant stenosis. Renals: Single left and duplicated right renal arteries are patent. No evidence of stenosis or occlusion. Renal nephrograms are symmetrical. IMA: Patent without evidence of aneurysm, dissection, vasculitis or significant stenosis. Inflow: Patent without evidence of aneurysm, dissection, vasculitis or significant stenosis. Proximal Outflow: Bilateral common femoral and visualized portions of the superficial and profunda femoral arteries are patent without evidence of aneurysm, dissection, vasculitis or significant stenosis. Veins: No obvious venous abnormality within the limitations of this arterial phase study. Review of the MIP images confirms the above findings. NON-VASCULAR Lower chest: Lung bases are clear. Hepatobiliary: No focal liver abnormality is seen. No gallstones, gallbladder wall thickening, or biliary dilatation. Pancreas: Unremarkable. No pancreatic ductal dilatation or surrounding inflammatory changes. Spleen: Normal in size without focal abnormality. Adrenals/Urinary Tract: No adrenal gland nodules. Kidneys are symmetrical in size and shape. Renal nephrograms are homogeneous and symmetrical. Normal corticomedullary differentiation. No perfusion defects to suggest infarct. No hydronephrosis. Normal opacification of the intrarenal collecting systems. Bladder wall is not thickened and no filling defects are demonstrated. Stomach/Bowel: Stomach is within normal limits. Appendix  appears normal. No evidence of bowel wall thickening, distention, or inflammatory changes. Peroneal focus of increased density anterior to the rectum possibly representing hemorrhagic cyst or soft tissue  calcification. Lymphatic: No significant lymphadenopathy. Reproductive: Status post hysterectomy. No adnexal masses. Other: No abdominal wall hernia or abnormality. No abdominopelvic ascites. Musculoskeletal: No acute or significant osseous findings. IMPRESSION: VASCULAR No evidence of abdominal aortic aneurysm or dissection. Major arterial structures are patent. Renal nephrograms are symmetrical. No evidence of renal infarct or obstruction. NON-VASCULAR No acute process demonstrated in the abdomen or pelvis. Electronically Signed   By: Lucienne Capers M.D.   On: 09/13/2016 23:58    Procedures Procedures (including critical care time)  Medications Ordered in ED Medications  fentaNYL (SUBLIMAZE) injection 100 mcg (100 mcg Intravenous Given 09/13/16 2055)  ondansetron (ZOFRAN) injection 4 mg (4 mg Intravenous Given 09/13/16 2055)  sodium chloride 0.9 % bolus 1,000 mL (0 mLs Intravenous Stopped 09/13/16 2202)  ketorolac (TORADOL) 30 MG/ML injection 30 mg (30 mg Intravenous Given 09/13/16 2233)  oxyCODONE-acetaminophen (PERCOCET/ROXICET) 5-325 MG per tablet 2 tablet (2 tablets Oral Given 09/13/16 2233)  iopamidol (ISOVUE-370) 76 % injection (100 mLs Intravenous Contrast Given 09/13/16 2318)     Initial Impression / Assessment and Plan / ED Course  I have reviewed the triage vital signs and the nursing notes.  Pertinent labs & imaging results that were available during my care of the patient were reviewed by me and considered in my medical decision making (see chart for details).     After all workup I'm not entirely clear the cause of her symptoms. No evidence for renal infarct, kidney stone, aneurysm, any other intra-abdominal emergencies. Patient's pain somewhat controlled at time of discharge but not totally gone. I encouraged close PCP follow-up versus strict return precautions if symptoms are worsening or not getting better.  Final Clinical Impressions(s) / ED Diagnoses   Final diagnoses:  Flank  pain    New Prescriptions Discharge Medication List as of 09/14/2016 12:44 AM       Lesia Monica, Corene Cornea, MD 09/14/16 2354

## 2016-09-26 ENCOUNTER — Other Ambulatory Visit: Payer: Self-pay

## 2016-09-26 ENCOUNTER — Ambulatory Visit: Payer: Self-pay | Attending: Internal Medicine | Admitting: Physician Assistant

## 2016-09-26 VITALS — BP 110/78 | HR 93 | Temp 98.3°F | Resp 18 | Ht 62.0 in | Wt 146.4 lb

## 2016-09-26 DIAGNOSIS — Z90710 Acquired absence of both cervix and uterus: Secondary | ICD-10-CM | POA: Insufficient documentation

## 2016-09-26 DIAGNOSIS — Z8632 Personal history of gestational diabetes: Secondary | ICD-10-CM | POA: Insufficient documentation

## 2016-09-26 DIAGNOSIS — M545 Low back pain, unspecified: Secondary | ICD-10-CM

## 2016-09-26 DIAGNOSIS — R3129 Other microscopic hematuria: Secondary | ICD-10-CM

## 2016-09-26 DIAGNOSIS — R109 Unspecified abdominal pain: Secondary | ICD-10-CM | POA: Insufficient documentation

## 2016-09-26 DIAGNOSIS — R739 Hyperglycemia, unspecified: Secondary | ICD-10-CM

## 2016-09-26 DIAGNOSIS — Z801 Family history of malignant neoplasm of trachea, bronchus and lung: Secondary | ICD-10-CM | POA: Insufficient documentation

## 2016-09-26 DIAGNOSIS — R319 Hematuria, unspecified: Secondary | ICD-10-CM | POA: Insufficient documentation

## 2016-09-26 DIAGNOSIS — R0789 Other chest pain: Secondary | ICD-10-CM | POA: Insufficient documentation

## 2016-09-26 DIAGNOSIS — Z823 Family history of stroke: Secondary | ICD-10-CM | POA: Insufficient documentation

## 2016-09-26 DIAGNOSIS — Z880 Allergy status to penicillin: Secondary | ICD-10-CM | POA: Insufficient documentation

## 2016-09-26 DIAGNOSIS — Z8249 Family history of ischemic heart disease and other diseases of the circulatory system: Secondary | ICD-10-CM | POA: Insufficient documentation

## 2016-09-26 LAB — POCT GLYCOSYLATED HEMOGLOBIN (HGB A1C): Hemoglobin A1C: 4.8

## 2016-09-26 LAB — POCT CBG (FASTING - GLUCOSE)-MANUAL ENTRY: Glucose Fasting, POC: 121 mg/dL — AB (ref 70–99)

## 2016-09-26 MED ORDER — METHOCARBAMOL 500 MG PO TABS: 500.0000 mg | ORAL_TABLET | Freq: Three times a day (TID) | ORAL | 0 refills | Status: DC

## 2016-09-26 NOTE — Addendum Note (Signed)
Addended byBrayton Caves on: 09/26/2016 01:32 PM   Modules accepted: Level of Service

## 2016-09-26 NOTE — Progress Notes (Signed)
JAPatient is here for hospital f/up   Patient complains about lower back pain

## 2016-09-26 NOTE — Progress Notes (Signed)
Kathy Hunt  EXH:[id number]  [id number]:[id number]  DOB - March 20, 1970  Chief Complaint  Patient presents with  . Follow-up  . Back Pain       Subjective:   Kathy Hunt is a 46 y.o. female here today for establishment of care. She is relatively healthy she has a past medical history of gestational diabetes mellitus and has had a hysterectomy. She has not seen a primary care provider in at least 11 years. She presented to the emergency room on 09/14/2016 with complaints of bilateral back/flank pain. She states that a few days prior to her emergency room visit she was actually having some anterior chest tightness with some associated shortness of breath later it moved to the bilateral lower back and has been present every since. Occasionally pain in bilateral hips. Doesn't remember any muscle strains, moving furniture and anything too strenuous to cause the episode.  Her emergency department visit showed normal labs. She did have a moderate amount of blood in her urine. Other labs looked okay. CT renal stone study was negative for an acute process. CT angiogram negative as well for an acute process. She was treated with fentanyl, Zofran, IV fluids, Toradol and oxycodone. She still has some degree of discomfort in her back upon leaving.  She states that she was given a prescription for ibuprofen which she hasn't really taken. Sometimes she takes Tylenol with temporary relief. She continues with the same bilateral lower back/flank pain. This is accompanied by fatigue and difficulty going about her day. Uncomfortable sleeping. Loss of appetite. No chest pain. No palpitations. No lightheadedness. No syncope.  She is wondering if she is diabetic. She has a meter at home and sometimes she has elevated numbers and sometimes she has low numbers. She's not specifically what numbers she is getting  ROS: GEN: denies fever or chills, denies change in weight Skin: denies lesions or rashes HEENT: denies  headache, earache, epistaxis, sore throat, or neck pain LUNGS: denies SHOB, dyspnea, PND, orthopnea CV: denies CP or palpitations ABD: denies abd pain, N or V EXT: + muscle spasms or swelling; no pain in lower ext, no weakness NEURO: denies numbness or tingling, denies sz, stroke or TIA  ALLERGIES: Allergies  Allergen Reactions  . Penicillins     Has patient had a PCN reaction causing immediate rash, facial/tongue/throat swelling, SOB or lightheadedness with hypotension:YES Has patient had a PCN reaction causing severe rash involving mucus membranes or skin necrosis: NO Has patient had a PCN reaction that required hospitalization NO Has patient had a PCN reaction occurring within the last 10 years: NO If all of the above answers are "NO", then may proceed with Cephalosporin use.    PAST MEDICAL HISTORY: Past Medical History:  Diagnosis Date  . Gestational diabetes     PAST SURGICAL HISTORY: Past Surgical History:  Procedure Laterality Date  . ABDOMINAL HYSTERECTOMY      MEDICATIONS AT HOME: Prior to Admission medications   Medication Sig Start Date End Date Taking? Authorizing Provider  ibuprofen (ADVIL,MOTRIN) 800 MG tablet Take 1 tablet (800 mg total) by mouth every 8 (eight) hours as needed. Patient not taking: Reported on 09/26/2016 09/14/16   Mesner, Corene Cornea, MD  methocarbamol (ROBAXIN) 500 MG tablet Take 1 tablet (500 mg total) by mouth 3 (three) times daily. 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 Speech Clear. Not toxic looking HEENT: Atraumatic and Normocephalic, pupils equally reactive to light and accomodation Neck: supple, no JVD. No  cervical lymphadenopathy.  Chest:Good air entry bilaterally, no added sounds  CVS: S1 S2 regular, no murmurs.  Abdomen: Bowel sounds present, Non tender and not distended with no guarding, rigidity or rebound. Extremities: B/L Lower Ext shows no edema, both legs are warm to touch Neurology: Awake alert, and oriented X 3, CN II-XII intact, Non focal Skin:No Rash Wounds:N/A  Data Review Lab Results  Component Value Date   HGBA1C 4.8 09/26/2016    Assessment & Plan  1. Bilateral Flank Pain, sounds MSK  -cont Ibuprofren and Tylenol prn  -stretching/strengthening exercises discussed  -Add Robaxin 2. Hematuria  -Urology referral  3. Hyperglycemia   -cont to check BS prn, no evidence of DM2 today  -A1C and CBG reviewed  -low carb diet  -Aim for 30 minutes of exercise most days. Rethink what you drink. Water is great! Aim for 2-3 Carb Choices per meal (30-45 grams) +/- 1 either way  Aim for 0-15 Carbs per snack if hungry  Include protein in moderation with your meals and snacks  Consider reading food labels for Total Carbohydrate and Fat Grams of foods  Consider checking BG at alternate times per day  Continue taking medication as directed Be mindful about how much sugar you are adding to beverages and other foods. Fruit Punch - find one with no sugar  Measure and decrease portions of carbohydrate foods  Make your plate and don't go back for seconds 4. Atypical CP   -EKG reassuring  -check Troponin   Return in about 2 weeks (around 10/10/2016).  The patient was given clear instructions to go to ER or return to medical center if symptoms don't improve, worsen or new problems develop. The patient verbalized understanding. The patient was told to call to get lab results if they haven't heard anything in the next week.   Total time spent with patient was 46 min . Greater than 50 % of this visit was spent face to face counseling and coordinating care regarding risk factor  modification, compliance importance and encouragement, education related to urologic workup needs and pain control.  This note has been created with Surveyor, quantity. Any transcriptional errors are unintentional.    Zettie Pho, PA-C [facility] and [facility] [place], [place]   09/26/2016, 1:23 PM

## 2016-09-27 LAB — BASIC METABOLIC PANEL
BUN/Creatinine Ratio: 11 (ref 9–23)
BUN: 6 mg/dL (ref 6–24)
CO2: 21 mmol/L (ref 20–29)
CREATININE: 0.55 mg/dL — AB (ref 0.57–1.00)
Calcium: 9.5 mg/dL (ref 8.7–10.2)
Chloride: 106 mmol/L (ref 96–106)
GFR calc Af Amer: 131 mL/min/{1.73_m2} (ref >59)
GFR calc non Af Amer: 114 mL/min/{1.73_m2} (ref >59)
Glucose: 84 mg/dL (ref 65–99)
POTASSIUM: 3.8 mmol/L (ref 3.5–5.2)
Sodium: 143 mmol/L (ref 134–144)

## 2016-09-27 LAB — TROPONIN T: Troponin T TROPT: <0.011 ng/mL (ref <0.011)

## 2016-09-29 ENCOUNTER — Telehealth: Payer: Self-pay | Admitting: General Practice

## 2016-09-29 NOTE — Telephone Encounter (Signed)
Pt called requesting status of referral to urology, pt was informed referral coordinator is out of office . Pt states she is still in a lot of pain and is requesting advice on what to do. Please f/up

## 2016-10-02 NOTE — Telephone Encounter (Signed)
Patient verified DOB Patient is aware of being able to contact [facility] for an initial self pay appointment. 250$ is the minimum required to be seen and a payment plan may be set up for the remainder. Patient is also aware of [facility] card being accepted and the co pay being required for the visit. Patient was transferred to the front desk to schedule financial appointment and states she will come up with the 250$ to be seen and complete the [facility] card for follow up visits. No further questions at this time.

## 2016-10-13 ENCOUNTER — Ambulatory Visit: Payer: Self-pay | Attending: Internal Medicine

## 2016-11-03 ENCOUNTER — Ambulatory Visit: Payer: Self-pay | Attending: Family Medicine | Admitting: Family Medicine

## 2016-11-03 ENCOUNTER — Encounter: Payer: Self-pay | Admitting: Family Medicine

## 2016-11-03 ENCOUNTER — Ambulatory Visit (HOSPITAL_COMMUNITY)
Admission: RE | Admit: 2016-11-03 | Discharge: 2016-11-03 | Disposition: A | Discharge status: Home / Self Care | Payer: Self-pay | Source: Ambulatory Visit | Attending: Family Medicine | Admitting: Family Medicine

## 2016-11-03 VITALS — BP 115/75 | HR 86 | Temp 98.4°F | Resp 18 | Ht 62.0 in | Wt 146.6 lb

## 2016-11-03 DIAGNOSIS — Z87898 Personal history of other specified conditions: Secondary | ICD-10-CM

## 2016-11-03 DIAGNOSIS — Z90710 Acquired absence of both cervix and uterus: Secondary | ICD-10-CM | POA: Insufficient documentation

## 2016-11-03 DIAGNOSIS — R109 Unspecified abdominal pain: Secondary | ICD-10-CM | POA: Insufficient documentation

## 2016-11-03 DIAGNOSIS — M545 Low back pain: Secondary | ICD-10-CM

## 2016-11-03 DIAGNOSIS — Z87448 Personal history of other diseases of urinary system: Secondary | ICD-10-CM

## 2016-11-03 DIAGNOSIS — G8929 Other chronic pain: Secondary | ICD-10-CM | POA: Insufficient documentation

## 2016-11-03 DIAGNOSIS — R3129 Other microscopic hematuria: Secondary | ICD-10-CM

## 2016-11-03 DIAGNOSIS — K5900 Constipation, unspecified: Secondary | ICD-10-CM | POA: Insufficient documentation

## 2016-11-03 LAB — POCT URINALYSIS DIPSTICK
BILIRUBIN UA: NEGATIVE
GLUCOSE UA: NEGATIVE
Ketones, UA: NEGATIVE
LEUKOCYTES UA: NEGATIVE
NITRITE UA: NEGATIVE
Protein, UA: NEGATIVE
Spec Grav, UA: 1.01 (ref 1.010–1.025)
UROBILINOGEN UA: 0.2 U/dL
pH, UA: 7 (ref 5.0–8.0)

## 2016-11-03 MED ORDER — METHOCARBAMOL 750 MG PO TABS: 750.0000 mg | ORAL_TABLET | Freq: Three times a day (TID) | ORAL | 0 refills | Status: DC | PRN

## 2016-11-03 MED ORDER — IBUPROFEN 800 MG PO TABS: 800.0000 mg | ORAL_TABLET | Freq: Three times a day (TID) | ORAL | 0 refills | Status: DC | PRN

## 2016-11-03 MED FILL — IBUPROFEN 800 MG TABLET: 800 | 10 days supply | Qty: 30 | Fill #0

## 2016-11-03 MED FILL — METHOCARBAMOL 750 MG TABLET: 750 | 10 days supply | Qty: 30 | Fill #0

## 2016-11-03 NOTE — Progress Notes (Signed)
Subjective:  Patient ID: Kathy Hunt, female    DOB: 12/27/1970  Age: 46 y.o. MRN: [id number]  CC: Establish Care and Follow-up   HPI Kathy Hunt presents for follow up. History of bilateral flank pain and hematuria. History of ED visit in Sept 2018. Evaluation has included CT ABD/pelvis, labs. Last office visit microscopic hematuria present and  she was referred to urology specialist and was encouraged to follow up with PCP if symptoms persist. She does reports history of nephrolithiasis 11 years ago. She also reports history of hysterectomy 17 years ago after uterine prolapse. She reports uterine prolapse did cause bladder to descend some as well. She denies any hematuria, bladder incontinence, cloudy/foul smelling urine. She denies any heavy weight lifting, statin use, or back injury. She reports taking ibuprofen and muscle relaxant with no more than moderate relief of symptoms.  Outpatient Medications Prior to Visit  Medication Sig Dispense Refill  . ibuprofen (ADVIL,MOTRIN) 800 MG tablet Take 1 tablet (800 mg total) by mouth every 8 (eight) hours as needed. (Patient not taking: Reported on 09/26/2016) 21 tablet 0  . methocarbamol (ROBAXIN) 500 MG tablet Take 1 tablet (500 mg total) by mouth 3 (three) times daily. 30 tablet 0   No facility-administered medications prior to visit.     ROS Review of Systems  Constitutional: Negative.   Respiratory: Negative.   Cardiovascular: Negative.   Gastrointestinal: Negative.   Genitourinary: Negative.   Musculoskeletal: Positive for back pain.    Objective:  BP 115/75 (BP Location: Left Arm, Patient Position: Sitting, Cuff Size: Normal)   Pulse 86   Temp 98.4 F (36.9 C) (Oral)   Resp 18   Ht 5\' 2"  (1.575 m)   Wt 146 lb 9.6 oz (66.5 kg)   SpO2 98%   BMI 26.81 kg/m   BP/Weight 11/03/2016 0/27/7412 08/13/8674  Systolic BP 720 947 096  Diastolic BP 75 78 77  Wt. (Lbs) 146.6 146.4 140  BMI 26.81 26.78 26.45     Physical Exam    Constitutional: She appears well-developed and well-nourished.  Cardiovascular: Normal rate, regular rhythm, normal heart sounds and intact distal pulses.   Pulmonary/Chest: Effort normal and breath sounds normal.  Abdominal: Soft. Bowel sounds are normal. There is tenderness in the suprapubic area. There is no CVA tenderness.  Musculoskeletal: Normal range of motion. She exhibits tenderness (bilateral lumbar with palpation).  Negative straight leg test  Skin: Skin is warm and dry.  Nursing note and vitals reviewed.    Assessment & Plan:   1. Chronic bilateral low back pain without sciatica Evaluate for other causes of lower back pain. Patient hasn't seen a PCP in 11 years. Encouraged to schedule appointment for physical. - DG Lumbar Spine Complete; Future - methocarbamol (ROBAXIN) 750 MG tablet; Take 1 tablet (750 mg total) by mouth every 8 (eight) hours as needed for muscle spasms.  Dispense: 30 tablet; Refill: 0 - ibuprofen (ADVIL,MOTRIN) 800 MG tablet; Take 1 tablet (800 mg total) by mouth every 8 (eight) hours as needed for moderate pain (Take with food.).  Dispense: 30 tablet; Refill: 0  2. History of hematuria  - Urinalysis Dipstick  3. Other microscopic hematuria Rule out nephrolithiasis  - CBC - Basic metabolic panel - DG Abd 1 View; Future  4. History of bilateral flank pain  - CBC - Basic metabolic panel - DG Abd 1 View; Future   Meds ordered this encounter  Medications  . methocarbamol (ROBAXIN) 750 MG tablet  Sig: Take 1 tablet (750 mg total) by mouth every 8 (eight) hours as needed for muscle spasms.    Dispense:  30 tablet    Refill:  0    Order Specific Question:   Supervising Provider    Answer:   Tresa Garter [id number]  . ibuprofen (ADVIL,MOTRIN) 800 MG tablet    Sig: Take 1 tablet (800 mg total) by mouth every 8 (eight) hours as needed for moderate pain (Take with food.).    Dispense:  30 tablet    Refill:  0    Order Specific Question:    Supervising Provider    Answer:   Tresa Garter [id number]    Follow-up: Return in about 6 weeks (around 12/15/2016), or if symptoms worsen or fail to improve, for Follow Up .   Alfonse Spruce FNP

## 2016-11-03 NOTE — Patient Instructions (Signed)

## 2016-11-03 NOTE — Progress Notes (Signed)
Patient is here for 2 weeks f/up

## 2016-11-04 ENCOUNTER — Other Ambulatory Visit: Payer: Self-pay | Admitting: Family Medicine

## 2016-11-04 DIAGNOSIS — K5900 Constipation, unspecified: Secondary | ICD-10-CM

## 2016-11-04 DIAGNOSIS — R109 Unspecified abdominal pain: Secondary | ICD-10-CM

## 2016-11-04 DIAGNOSIS — M545 Low back pain, unspecified: Secondary | ICD-10-CM

## 2016-11-04 DIAGNOSIS — G8929 Other chronic pain: Secondary | ICD-10-CM

## 2016-11-04 DIAGNOSIS — R3129 Other microscopic hematuria: Secondary | ICD-10-CM

## 2016-11-04 LAB — BASIC METABOLIC PANEL
BUN/Creatinine Ratio: 9 (ref 9–23)
BUN: 6 mg/dL (ref 6–24)
CALCIUM: 9.4 mg/dL (ref 8.7–10.2)
CO2: 23 mmol/L (ref 20–29)
CREATININE: 0.66 mg/dL (ref 0.57–1.00)
Chloride: 104 mmol/L (ref 96–106)
GFR calc Af Amer: 123 mL/min/{1.73_m2} (ref >59)
GFR calc non Af Amer: 107 mL/min/{1.73_m2} (ref >59)
GLUCOSE: 109 mg/dL — AB (ref 65–99)
Potassium: 4 mmol/L (ref 3.5–5.2)
SODIUM: 139 mmol/L (ref 134–144)

## 2016-11-04 LAB — CBC
HEMATOCRIT: 36.2 % (ref 34.0–46.6)
HEMOGLOBIN: 12.4 g/dL (ref 11.1–15.9)
MCH: 32.7 pg (ref 26.6–33.0)
MCHC: 34.3 g/dL (ref 31.5–35.7)
MCV: 96 fL (ref 79–97)
Platelets: 259 10*3/uL (ref 150–379)
RBC: 3.79 x10E6/uL (ref 3.77–5.28)
RDW: 13.1 % (ref 12.3–15.4)
WBC: 6.7 10*3/uL (ref 3.4–10.8)

## 2016-11-04 MED ORDER — POLYETHYLENE GLYCOL 3350 17 GM/SCOOP PO POWD: 17.0000 g | Freq: Every day | ORAL | 1 refills | Status: AC

## 2016-11-05 ENCOUNTER — Telehealth: Payer: Self-pay

## 2016-11-05 MED FILL — POLYETHYLENE GLYCOL 3350: 30 days supply | Qty: 510 | Fill #0

## 2016-11-05 NOTE — Telephone Encounter (Signed)
-----   Message from Alfonse Spruce, [place] sent at 11/04/2016  9:15 PM EDT ----- ABD xray negative for any kidney stones. Evidence of moderate constipation. You will be prescribed miralax. Increase water and fiber intake. Xray of lumbar normal, shows no spine compression of the discs or fracture.  Labs normal. Kidney function normal. You will be referred to urology.

## 2016-11-05 NOTE — Telephone Encounter (Signed)
CMA call regarding lab results   Patient verify DOB  Patient was aware and understood  

## 2016-11-12 ENCOUNTER — Encounter: Payer: Self-pay | Admitting: Family Medicine

## 2016-11-20 NOTE — Telephone Encounter (Signed)
Patient email concern, please respond via Mychart or send response to assigned CMA

## 2016-12-08 ENCOUNTER — Encounter: Payer: Self-pay | Admitting: Family Medicine

## 2016-12-08 ENCOUNTER — Ambulatory Visit: Payer: Self-pay | Attending: Family Medicine | Admitting: Family Medicine

## 2016-12-08 VITALS — BP 103/72 | HR 83 | Temp 98.8°F | Resp 18 | Ht 62.0 in | Wt 144.4 lb

## 2016-12-08 DIAGNOSIS — Z87442 Personal history of urinary calculi: Secondary | ICD-10-CM | POA: Insufficient documentation

## 2016-12-08 DIAGNOSIS — M545 Low back pain, unspecified: Secondary | ICD-10-CM

## 2016-12-08 DIAGNOSIS — R3911 Hesitancy of micturition: Secondary | ICD-10-CM

## 2016-12-08 DIAGNOSIS — R339 Retention of urine, unspecified: Secondary | ICD-10-CM

## 2016-12-08 DIAGNOSIS — Z90710 Acquired absence of both cervix and uterus: Secondary | ICD-10-CM

## 2016-12-08 DIAGNOSIS — Z87448 Personal history of other diseases of urinary system: Secondary | ICD-10-CM

## 2016-12-08 DIAGNOSIS — G8929 Other chronic pain: Secondary | ICD-10-CM

## 2016-12-08 DIAGNOSIS — R3129 Other microscopic hematuria: Secondary | ICD-10-CM

## 2016-12-08 DIAGNOSIS — Z23 Encounter for immunization: Secondary | ICD-10-CM

## 2016-12-08 DIAGNOSIS — Z8742 Personal history of other diseases of the female genital tract: Secondary | ICD-10-CM

## 2016-12-08 DIAGNOSIS — R109 Unspecified abdominal pain: Secondary | ICD-10-CM | POA: Insufficient documentation

## 2016-12-08 DIAGNOSIS — N3943 Post-void dribbling: Secondary | ICD-10-CM

## 2016-12-08 DIAGNOSIS — Z791 Long term (current) use of non-steroidal anti-inflammatories (NSAID): Secondary | ICD-10-CM | POA: Insufficient documentation

## 2016-12-08 DIAGNOSIS — Z79899 Other long term (current) drug therapy: Secondary | ICD-10-CM | POA: Insufficient documentation

## 2016-12-08 DIAGNOSIS — N941 Unspecified dyspareunia: Secondary | ICD-10-CM

## 2016-12-08 DIAGNOSIS — M549 Dorsalgia, unspecified: Secondary | ICD-10-CM

## 2016-12-08 DIAGNOSIS — N814 Uterovaginal prolapse, unspecified: Secondary | ICD-10-CM | POA: Insufficient documentation

## 2016-12-08 LAB — POCT URINALYSIS DIPSTICK
BILIRUBIN UA: NEGATIVE
Glucose, UA: NEGATIVE
Ketones, UA: NEGATIVE
LEUKOCYTES UA: NEGATIVE
NITRITE UA: NEGATIVE
PH UA: 8 (ref 5.0–8.0)
Protein, UA: NEGATIVE
Spec Grav, UA: 1.01 (ref 1.010–1.025)
Urobilinogen, UA: 0.2 E.U./dL

## 2016-12-08 MED ORDER — IBUPROFEN 800 MG PO TABS
800.0000 mg | ORAL_TABLET | Freq: Three times a day (TID) | ORAL | 1 refills | Status: AC | PRN
Start: 2016-12-08 — End: ?

## 2016-12-08 MED ORDER — METHOCARBAMOL 750 MG PO TABS: 750.0000 mg | ORAL_TABLET | Freq: Three times a day (TID) | ORAL | 0 refills | Status: AC | PRN

## 2016-12-08 MED FILL — METHOCARBAMOL 750 MG TABLET: 750 | 10 days supply | Qty: 30 | Fill #0

## 2016-12-08 MED FILL — IBUPROFEN 800 MG TABLET: 800 | 10 days supply | Qty: 30 | Fill #0

## 2016-12-08 NOTE — Progress Notes (Signed)
Patient is here for f/up  

## 2016-12-08 NOTE — Patient Instructions (Signed)

## 2016-12-09 NOTE — Progress Notes (Signed)
Subjective:  Patient ID: Kathy Hunt, female    DOB: [birth year]  Age: 46 y.o. MRN: [id number]  CC: Follow-up   HPI ADALEY KIENE presents for follow up. History of bilateral flank pain and hematuria. History of ED visit in Sept 2018. Evaluation has included CT ABD/pelvis, labs. Last office visit microscopic hematuria present and  she was referred to urology specialist and was encouraged to follow up with PCP if symptoms persist. She does reports history of nephrolithiasis 11 years ago. She also reports history of hysterectomy 17 years ago after uterine prolapse. She reports uterine prolapse did cause bladder to descend some as well. She reports symptoms of urine hesitancy, urine dribbling, and the feeling of incomplete bladder emptying. She denies any hematuria, bladder incontinence, cloudy/foul smelling urine. She denies any heavy weight lifting, statin use, or back injury. She reports taking ibuprofen and muscle relaxant with  moderate relief of symptoms. Pain level from 10/10 reduced to 5/10 with medication use.  She complains of dyspareunia    Outpatient Medications Prior to Visit  Medication Sig Dispense Refill  . ibuprofen (ADVIL,MOTRIN) 800 MG tablet Take 1 tablet (800 mg total) by mouth every 8 (eight) hours as needed for moderate pain (Take with food.). 30 tablet 0  . methocarbamol (ROBAXIN) 750 MG tablet Take 1 tablet (750 mg total) by mouth every 8 (eight) hours as needed for muscle spasms. 30 tablet 0  . polyethylene glycol powder (GLYCOLAX/MIRALAX) powder Take 17 g by mouth daily. 3350 g 1   No facility-administered medications prior to visit.     ROS Review of Systems  Constitutional: Negative.   Respiratory: Negative.   Cardiovascular: Negative.   Gastrointestinal: Negative.   Genitourinary: Negative.   Musculoskeletal: Positive for back pain.    Objective:  BP 103/72 (BP Location: Left Arm, Patient Position: Sitting, Cuff Size: Normal)   Pulse 83   Temp 98.8 F  (37.1 C) (Oral)   Resp 18   Ht 5\' 2"  (1.575 m)   Wt 144 lb 6.4 oz (65.5 kg)   HC 18" (45.7 cm)   SpO2 98%   BMI 26.41 kg/m   BP/Weight 12/08/2016 11/03/2016 6/71/2458  Systolic BP 099 833 825  Diastolic BP 72 75 78  Wt. (Lbs) 144.4 146.6 146.4  BMI 26.41 26.81 26.78     Physical Exam  Constitutional: She appears well-developed and well-nourished.  Cardiovascular: Normal rate, regular rhythm, normal heart sounds and intact distal pulses.  Pulmonary/Chest: Effort normal and breath sounds normal.  Abdominal: Soft. Bowel sounds are normal. There is tenderness in the suprapubic area. There is no CVA tenderness.  Musculoskeletal: Normal range of motion. She exhibits tenderness (bilateral lumbar with palpation).  Skin: Skin is warm and dry.  Nursing note and vitals reviewed.    Assessment & Plan:   1. Chronic back pain greater than 3 months duration  - methocarbamol (ROBAXIN) 750 MG tablet; Take 1 tablet (750 mg total) by mouth every 8 (eight) hours as needed for muscle spasms.  Dispense: 30 tablet; Refill: 0 - ibuprofen (ADVIL,MOTRIN) 800 MG tablet; Take 1 tablet (800 mg total) by mouth every 8 (eight) hours as needed for moderate pain or cramping (Take with food.).  Dispense: 30 tablet; Refill: 1  2. Incomplete bladder emptying Referral to urology placed. Urology referral previously placed she was informed that slots for orange card are limited and it may take longer to obtain appointment. - Ambulatory referral to Urology  3. Dyspareunia in female  - Ambulatory  referral to Gynecology  4. Urinary hesitancy  - Ambulatory referral to Urology  5. Dribbling of urine  - Ambulatory referral to Urology  6. History of uterine prolapse  - Ambulatory referral to Gynecology  7. History of hematuria  - POCT urinalysis dipstick - Ambulatory referral to Urology  8. Needs flu shot  - Flu Vaccine QUAD 6+ mos PF IM (Fluarix Quad PF)  9. History of hysterectomy  - Ambulatory  referral to Gynecology  10. Microscopic hematuria - Ambulatory referral to Urology  11. Chronic bilateral low back pain without sciatica  - methocarbamol (ROBAXIN) 750 MG tablet; Take 1 tablet (750 mg total) by mouth every 8 (eight) hours as needed for muscle spasms.  Dispense: 30 tablet; Refill: 0 - ibuprofen (ADVIL,MOTRIN) 800 MG tablet; Take 1 tablet (800 mg total) by mouth every 8 (eight) hours as needed for moderate pain or cramping (Take with food.).  Dispense: 30 tablet; Refill: 1    Follow-up: Return if symptoms worsen or fail to improve.   Alfonse Spruce FNP

## 2017-01-29 ENCOUNTER — Encounter: Payer: Self-pay | Admitting: Obstetrics & Gynecology

## 2017-02-03 MED FILL — IBUPROFEN 800 MG TABLET: 800 | 10 days supply | Qty: 30 | Fill #1

## 2017-02-13 ENCOUNTER — Encounter: Payer: Self-pay | Admitting: Obstetrics & Gynecology

## 2017-02-13 ENCOUNTER — Ambulatory Visit (INDEPENDENT_AMBULATORY_CARE_PROVIDER_SITE_OTHER): Payer: Self-pay | Admitting: Obstetrics & Gynecology

## 2017-02-13 VITALS — BP 126/93 | HR 83 | Wt 146.0 lb

## 2017-02-13 DIAGNOSIS — N941 Unspecified dyspareunia: Secondary | ICD-10-CM

## 2017-02-13 DIAGNOSIS — R3121 Asymptomatic microscopic hematuria: Secondary | ICD-10-CM

## 2017-02-13 NOTE — Progress Notes (Signed)
Patient ID: Kathy Hunt, female   DOB: 07-Oct-1970, 47 y.o.   MRN: [id number]  Chief Complaint  Patient presents with  . Hematuria  . Back Pain    HPI Kathy Hunt is a 47 y.o. female.  Divorced P3 (26, 55, and 52 yo kids, no grands). Here with the issues of blood in her urine one time. She was seen at [facility] who gave her reassurance. She occasionly gets some pelvic pain, both sides on occasion. She had not had sex for 6 months due to pain with sex. She is getting lubricated. She has deep dyspareunia. She has been with this fellow for 10 years.  HPI  Past Medical History:  Diagnosis Date  . Gestational diabetes     Past Surgical History:  Procedure Laterality Date  . ABDOMINAL HYSTERECTOMY      No family history on file.  Social History Social History   Tobacco Use  . Smoking status: Never Smoker  . Smokeless tobacco: Never Used  Substance Use Topics  . Alcohol use: No  . Drug use: No    Allergies  Allergen Reactions  . Penicillins     Has patient had a PCN reaction causing immediate rash, facial/tongue/throat swelling, SOB or lightheadedness with hypotension:YES Has patient had a PCN reaction causing severe rash involving mucus membranes or skin necrosis: NO Has patient had a PCN reaction that required hospitalization NO Has patient had a PCN reaction occurring within the last 10 years: NO If all of the above answers are "NO", then may proceed with Cephalosporin use.    Current Outpatient Medications  Medication Sig Dispense Refill  . ibuprofen (ADVIL,MOTRIN) 800 MG tablet Take 1 tablet (800 mg total) by mouth every 8 (eight) hours as needed for moderate pain or cramping (Take with food.). 30 tablet 1  . methocarbamol (ROBAXIN) 750 MG tablet Take 1 tablet (750 mg total) by mouth every 8 (eight) hours as needed for muscle spasms. (Patient not taking: Reported on 02/13/2017) 30 tablet 0  . polyethylene glycol powder (GLYCOLAX/MIRALAX) powder Take 17 g by  mouth daily. (Patient not taking: Reported on 02/13/2017) 3350 g 1   No current facility-administered medications for this visit.     Review of Systems Review of Systems  Blood pressure (!) 126/93, pulse 83, weight 146 lb (66.2 kg).  Physical Exam Physical Exam  Breathing, conversing, and ambulating normally Well nourished, well hydrated White female, no apparent distress Her vagina has no prolapse, normal mucosa and discharge Bimanual exam reveals no masses, she has some tenderness in the right lower quadrant.  Data Reviewed   Assessment   deep dyspareunia Microscopic hematuria- reassurance given by the urologist     Plan    Gyn ultrasound Order urine culture Mammogram scholarship info given Rec chiropractor for her back pain Come back 1 month       [place] 02/13/2017, 9:32 AM

## 2017-02-15 LAB — URINE CULTURE: Organism ID, Bacteria: NO GROWTH

## 2017-02-19 ENCOUNTER — Ambulatory Visit (HOSPITAL_COMMUNITY)
Admission: RE | Admit: 2017-02-19 | Discharge: 2017-02-19 | Disposition: A | Discharge status: Home / Self Care | Payer: Self-pay | Source: Ambulatory Visit | Attending: Obstetrics & Gynecology | Admitting: Obstetrics & Gynecology

## 2017-02-19 DIAGNOSIS — N941 Unspecified dyspareunia: Secondary | ICD-10-CM | POA: Insufficient documentation

## 2017-02-19 DIAGNOSIS — Z90710 Acquired absence of both cervix and uterus: Secondary | ICD-10-CM | POA: Insufficient documentation

## 2017-02-19 DIAGNOSIS — N83202 Unspecified ovarian cyst, left side: Secondary | ICD-10-CM | POA: Insufficient documentation

## 2017-02-25 ENCOUNTER — Encounter: Payer: Self-pay | Admitting: Obstetrics & Gynecology

## 2017-04-08 ENCOUNTER — Encounter: Payer: Self-pay | Admitting: Obstetrics & Gynecology

## 2017-04-08 ENCOUNTER — Ambulatory Visit (INDEPENDENT_AMBULATORY_CARE_PROVIDER_SITE_OTHER): Payer: Self-pay | Admitting: Clinical

## 2017-04-08 ENCOUNTER — Ambulatory Visit (INDEPENDENT_AMBULATORY_CARE_PROVIDER_SITE_OTHER): Payer: Self-pay | Admitting: Obstetrics & Gynecology

## 2017-04-08 VITALS — BP 131/78 | HR 89 | Wt 144.5 lb

## 2017-04-08 DIAGNOSIS — R102 Pelvic and perineal pain: Secondary | ICD-10-CM

## 2017-04-08 DIAGNOSIS — Z6379 Other stressful life events affecting family and household: Secondary | ICD-10-CM

## 2017-04-08 NOTE — BH Specialist Note (Signed)
Integrated Behavioral Health Initial Visit  MRN: [id number] Name: Kathy Hunt  Number of [place] Clinician visits:: 1/6 Session Start time: 2:12 Session End time: 2:48 Total time: 30 minutes  Type of Service: [place] Interpretor:No. Interpretor Name and Language: n/a   Warm Hand Off Completed.       SUBJECTIVE: Kathy Hunt is a 47 y.o. female accompanied by n/a Patient was referred by Dr Hulan Fray for  chronic pain . Patient reports the following symptoms/concerns: Pt states her primary concern is back and pelvic pain,  as well as life stress and previous trauma affecting her household, all in the month of April.  Pt says she copes "by just dealing with it", as she has not talked about her feelings in the past, but is open to learning self-coping strategies today.  Duration of problem: Over six months; Severity of problem: moderate  OBJECTIVE: Mood: Anxious and Depressed and Affect: Depressed and Tearful Risk of harm to self or others: No plan to harm self or others  LIFE CONTEXT: Family and Social: Pt lives with 82yo son; close to her 2 grown children, boyfriend, and cares for her grandmother with Alzheimers. 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 Reduce symptoms of: anxiety, depression, insomnia and stress 2. Increase knowledge and/or ability of: self-management skills and stress reduction  3. Demonstrate ability to: Increase healthy adjustment to current life circumstances and Increase adequate support systems for patient/family  INTERVENTIONS: Interventions utilized: Mindfulness or Psychologist, educational, Brief CBT, Psychoeducation and/or Health Education and  Link to [employer]  Standardized Assessments completed: GAD-7 and PHQ 9  ASSESSMENT: Patient currently experiencing Stressful life events affecting family and household   Patient may benefit from psychoeducation and brief therapeutic interventions regarding coping with symptoms of chronic pain, anxiety and depression related to stressful life events .  PLAN: 1. Follow up with behavioral health clinician on : As requested by patient 2. Behavioral recommendations:  -CALM relaxation breathing exercise daily, as needed throughout the day and night -Consider attending  NAMI Family-to-Family classes/group support -Read educational materials regarding coping with symptoms of chronic pain, anxiety, and depression -Consider apps as additional self-coping strategies -Consider [facility] [place] as additional community resource/social support   3. Referral(s): [place] (In Clinic) and [employer] Resources:  NA[facility] [place] 4. "From scale of 1-10, how likely are you to follow plan?": 8  Garlan Fair, LCSW  Depression screen [facility] 2/9 02/13/2017 12/08/2016 11/03/2016 09/26/2016  Decreased Interest 1 0 2 1  Down, Depressed, Hopeless 1 0 1 1  PHQ - 2 Score 2 0 3 2  Altered sleeping 2 2 3 2   Tired, decreased energy 2 2 2 3   Change in appetite 2 0 1 1  Feeling bad or failure about yourself  1 0 0 0  Trouble concentrating 1 1 1  0  Moving slowly or fidgety/restless 0 0 0 1  Suicidal thoughts 0 0 0 0  PHQ-9 Score 10 5 10 9    GAD 7 : Generalized Anxiety Score 02/13/2017 12/08/2016 11/03/2016 09/26/2016  Nervous, Anxious, on Edge 1 1 1 1   Control/stop worrying 2 1 2 1   Worry too much - different things 2 1 2 1   Trouble relaxing 2 1 2  2  Restless 2 1 2 1   Easily annoyed or irritable 2 1 2 1   Afraid - awful might happen 2 1 1 1   Total GAD 7 Score 13 7 12  8

## 2017-04-08 NOTE — Progress Notes (Signed)
   Subjective:    Patient ID: Kathy Hunt, female    DOB: [birth year], 47 y.o.   MRN: [id number]  HPI 47 yo divorced P3 here for follow up and ultrasound results done for pelvic pain and dyspareunia. She has not seen a chiropractor. She has not had sex in more than a month due to pain. She is frustrated because she feels that her personality is changing due to the pain.   Review of Systems     Objective:   Physical Exam  Breathing, conversing, and ambulating normally Well nourished, well hydrated White female, no apparent distress  Her ultrasound was normal (post hysterectomy), She does have a 1.5 x 1.4 cm left ovarian cyst (unchanged since 10/18)     Assessment & Plan:  Pelvic pain and dyspareunia- I have offered a diagnostic laparoscopy but told her that I doubt that I will find the cause of her pain. I have rec'd a referral to pelvic PT. She agrees with this. I also offered a trial of OCPs She will speak with Roselyn Reef today

## 2017-04-10 ENCOUNTER — Ambulatory Visit: Payer: Self-pay | Attending: Obstetrics & Gynecology | Admitting: Physical Therapy

## 2019-04-07 DIAGNOSIS — Z0000 Encounter for general adult medical examination without abnormal findings: Secondary | ICD-10-CM | POA: Diagnosis not present

## 2019-04-07 DIAGNOSIS — Z8632 Personal history of gestational diabetes: Secondary | ICD-10-CM | POA: Diagnosis not present

## 2019-04-07 DIAGNOSIS — R946 Abnormal results of thyroid function studies: Secondary | ICD-10-CM | POA: Diagnosis not present

## 2019-04-07 DIAGNOSIS — M7072 Other bursitis of hip, left hip: Secondary | ICD-10-CM | POA: Diagnosis not present

## 2019-04-07 DIAGNOSIS — M79604 Pain in right leg: Secondary | ICD-10-CM | POA: Diagnosis not present

## 2019-04-07 DIAGNOSIS — E7800 Pure hypercholesterolemia, unspecified: Secondary | ICD-10-CM | POA: Diagnosis not present

## 2019-04-13 DIAGNOSIS — Z0000 Encounter for general adult medical examination without abnormal findings: Secondary | ICD-10-CM | POA: Diagnosis not present

## 2019-04-13 DIAGNOSIS — Z8632 Personal history of gestational diabetes: Secondary | ICD-10-CM | POA: Diagnosis not present

## 2019-04-13 DIAGNOSIS — R946 Abnormal results of thyroid function studies: Secondary | ICD-10-CM | POA: Diagnosis not present

## 2019-05-23 ENCOUNTER — Other Ambulatory Visit: Payer: Self-pay | Admitting: Family Medicine

## 2019-05-23 ENCOUNTER — Ambulatory Visit (HOSPITAL_COMMUNITY)
Admission: RE | Admit: 2019-05-23 | Discharge: 2019-05-23 | Disposition: A | Discharge status: Home / Self Care | Payer: BLUE CROSS/BLUE SHIELD | Source: Ambulatory Visit | Attending: Family Medicine | Admitting: Family Medicine

## 2019-05-23 ENCOUNTER — Other Ambulatory Visit (HOSPITAL_COMMUNITY): Payer: Self-pay | Admitting: Family Medicine

## 2019-05-23 ENCOUNTER — Encounter (HOSPITAL_COMMUNITY): Payer: Self-pay

## 2019-05-23 ENCOUNTER — Other Ambulatory Visit: Payer: Self-pay

## 2019-05-23 ENCOUNTER — Ambulatory Visit (HOSPITAL_COMMUNITY): Payer: BLUE CROSS/BLUE SHIELD

## 2019-05-23 DIAGNOSIS — S6992XA Unspecified injury of left wrist, hand and finger(s), initial encounter: Secondary | ICD-10-CM | POA: Diagnosis not present

## 2019-05-23 DIAGNOSIS — S79912A Unspecified injury of left hip, initial encounter: Secondary | ICD-10-CM | POA: Diagnosis not present

## 2019-05-23 DIAGNOSIS — S0990XA Unspecified injury of head, initial encounter: Secondary | ICD-10-CM | POA: Diagnosis not present

## 2019-05-23 DIAGNOSIS — S069X9A Unspecified intracranial injury with loss of consciousness of unspecified duration, initial encounter: Secondary | ICD-10-CM

## 2019-05-23 DIAGNOSIS — M79642 Pain in left hand: Secondary | ICD-10-CM | POA: Diagnosis not present

## 2019-05-23 DIAGNOSIS — S3992XA Unspecified injury of lower back, initial encounter: Secondary | ICD-10-CM | POA: Diagnosis not present

## 2019-05-23 DIAGNOSIS — S59912A Unspecified injury of left forearm, initial encounter: Secondary | ICD-10-CM | POA: Diagnosis not present

## 2019-05-23 DIAGNOSIS — M25552 Pain in left hip: Secondary | ICD-10-CM | POA: Diagnosis not present

## 2019-05-23 DIAGNOSIS — S79911A Unspecified injury of right hip, initial encounter: Secondary | ICD-10-CM | POA: Diagnosis not present

## 2019-06-02 ENCOUNTER — Other Ambulatory Visit: Payer: Self-pay

## 2019-08-23 DIAGNOSIS — Z20822 Contact with and (suspected) exposure to covid-19: Secondary | ICD-10-CM | POA: Diagnosis not present

## 2019-12-06 DIAGNOSIS — M545 Low back pain, unspecified: Secondary | ICD-10-CM | POA: Diagnosis not present

## 2020-01-31 DIAGNOSIS — M2550 Pain in unspecified joint: Secondary | ICD-10-CM | POA: Diagnosis not present

## 2020-01-31 DIAGNOSIS — R202 Paresthesia of skin: Secondary | ICD-10-CM | POA: Diagnosis not present

## 2020-02-07 DIAGNOSIS — M533 Sacrococcygeal disorders, not elsewhere classified: Secondary | ICD-10-CM | POA: Diagnosis not present

## 2020-02-07 DIAGNOSIS — M79642 Pain in left hand: Secondary | ICD-10-CM | POA: Diagnosis not present

## 2020-02-07 DIAGNOSIS — M79671 Pain in right foot: Secondary | ICD-10-CM | POA: Diagnosis not present

## 2020-02-07 DIAGNOSIS — M79641 Pain in right hand: Secondary | ICD-10-CM | POA: Diagnosis not present

## 2020-02-07 DIAGNOSIS — M25552 Pain in left hip: Secondary | ICD-10-CM | POA: Diagnosis not present

## 2020-02-07 DIAGNOSIS — M2550 Pain in unspecified joint: Secondary | ICD-10-CM | POA: Diagnosis not present

## 2020-02-07 DIAGNOSIS — R700 Elevated erythrocyte sedimentation rate: Secondary | ICD-10-CM | POA: Diagnosis not present

## 2020-02-07 DIAGNOSIS — M545 Low back pain, unspecified: Secondary | ICD-10-CM | POA: Diagnosis not present

## 2020-02-07 DIAGNOSIS — M79672 Pain in left foot: Secondary | ICD-10-CM | POA: Diagnosis not present

## 2020-02-07 DIAGNOSIS — M25511 Pain in right shoulder: Secondary | ICD-10-CM | POA: Diagnosis not present

## 2020-02-29 DIAGNOSIS — M791 Myalgia, unspecified site: Secondary | ICD-10-CM | POA: Diagnosis not present

## 2020-02-29 DIAGNOSIS — R700 Elevated erythrocyte sedimentation rate: Secondary | ICD-10-CM | POA: Diagnosis not present

## 2020-02-29 DIAGNOSIS — M25552 Pain in left hip: Secondary | ICD-10-CM | POA: Diagnosis not present

## 2020-02-29 DIAGNOSIS — M2550 Pain in unspecified joint: Secondary | ICD-10-CM | POA: Diagnosis not present

## 2020-02-29 DIAGNOSIS — M25511 Pain in right shoulder: Secondary | ICD-10-CM | POA: Diagnosis not present

## 2020-03-21 DIAGNOSIS — M1990 Unspecified osteoarthritis, unspecified site: Secondary | ICD-10-CM | POA: Diagnosis not present

## 2020-04-10 DIAGNOSIS — R946 Abnormal results of thyroid function studies: Secondary | ICD-10-CM | POA: Diagnosis not present

## 2020-04-10 DIAGNOSIS — Z0000 Encounter for general adult medical examination without abnormal findings: Secondary | ICD-10-CM | POA: Diagnosis not present

## 2020-04-10 DIAGNOSIS — Z8632 Personal history of gestational diabetes: Secondary | ICD-10-CM | POA: Diagnosis not present

## 2020-04-11 DIAGNOSIS — M2550 Pain in unspecified joint: Secondary | ICD-10-CM | POA: Diagnosis not present

## 2020-04-11 DIAGNOSIS — R700 Elevated erythrocyte sedimentation rate: Secondary | ICD-10-CM | POA: Diagnosis not present

## 2020-04-11 DIAGNOSIS — L609 Nail disorder, unspecified: Secondary | ICD-10-CM | POA: Diagnosis not present

## 2020-04-11 DIAGNOSIS — E559 Vitamin D deficiency, unspecified: Secondary | ICD-10-CM | POA: Diagnosis not present

## 2020-04-17 DIAGNOSIS — E559 Vitamin D deficiency, unspecified: Secondary | ICD-10-CM | POA: Diagnosis not present

## 2020-04-17 DIAGNOSIS — Z0000 Encounter for general adult medical examination without abnormal findings: Secondary | ICD-10-CM | POA: Diagnosis not present

## 2020-04-17 DIAGNOSIS — R0789 Other chest pain: Secondary | ICD-10-CM | POA: Diagnosis not present

## 2020-04-17 DIAGNOSIS — R002 Palpitations: Secondary | ICD-10-CM | POA: Diagnosis not present

## 2020-05-22 DIAGNOSIS — L4050 Arthropathic psoriasis, unspecified: Secondary | ICD-10-CM | POA: Diagnosis not present

## 2020-05-22 DIAGNOSIS — R700 Elevated erythrocyte sedimentation rate: Secondary | ICD-10-CM | POA: Diagnosis not present

## 2020-05-22 DIAGNOSIS — L609 Nail disorder, unspecified: Secondary | ICD-10-CM | POA: Diagnosis not present

## 2020-05-22 DIAGNOSIS — E559 Vitamin D deficiency, unspecified: Secondary | ICD-10-CM | POA: Diagnosis not present

## 2020-06-25 DIAGNOSIS — L4050 Arthropathic psoriasis, unspecified: Secondary | ICD-10-CM | POA: Diagnosis not present

## 2020-07-06 DIAGNOSIS — E7800 Pure hypercholesterolemia, unspecified: Secondary | ICD-10-CM | POA: Diagnosis not present

## 2020-07-11 DIAGNOSIS — E7800 Pure hypercholesterolemia, unspecified: Secondary | ICD-10-CM | POA: Diagnosis not present

## 2020-07-11 DIAGNOSIS — Z8632 Personal history of gestational diabetes: Secondary | ICD-10-CM | POA: Diagnosis not present

## 2020-07-11 DIAGNOSIS — E559 Vitamin D deficiency, unspecified: Secondary | ICD-10-CM | POA: Diagnosis not present

## 2020-07-11 DIAGNOSIS — R946 Abnormal results of thyroid function studies: Secondary | ICD-10-CM | POA: Diagnosis not present

## 2020-07-19 DIAGNOSIS — Z1231 Encounter for screening mammogram for malignant neoplasm of breast: Secondary | ICD-10-CM | POA: Diagnosis not present

## 2020-07-23 DIAGNOSIS — Z79899 Other long term (current) drug therapy: Secondary | ICD-10-CM | POA: Diagnosis not present

## 2020-07-23 DIAGNOSIS — R110 Nausea: Secondary | ICD-10-CM | POA: Diagnosis not present

## 2020-07-23 DIAGNOSIS — M79643 Pain in unspecified hand: Secondary | ICD-10-CM | POA: Diagnosis not present

## 2020-07-23 DIAGNOSIS — L4050 Arthropathic psoriasis, unspecified: Secondary | ICD-10-CM | POA: Diagnosis not present

## 2020-07-24 DIAGNOSIS — L4050 Arthropathic psoriasis, unspecified: Secondary | ICD-10-CM | POA: Diagnosis not present

## 2020-08-08 DIAGNOSIS — N6011 Diffuse cystic mastopathy of right breast: Secondary | ICD-10-CM | POA: Diagnosis not present

## 2020-08-08 DIAGNOSIS — N6012 Diffuse cystic mastopathy of left breast: Secondary | ICD-10-CM | POA: Diagnosis not present

## 2020-09-18 DIAGNOSIS — L4050 Arthropathic psoriasis, unspecified: Secondary | ICD-10-CM | POA: Diagnosis not present

## 2020-10-03 DIAGNOSIS — M7741 Metatarsalgia, right foot: Secondary | ICD-10-CM | POA: Diagnosis not present

## 2020-10-03 DIAGNOSIS — M79671 Pain in right foot: Secondary | ICD-10-CM | POA: Diagnosis not present

## 2020-10-03 DIAGNOSIS — M2042 Other hammer toe(s) (acquired), left foot: Secondary | ICD-10-CM | POA: Diagnosis not present

## 2020-10-23 DIAGNOSIS — Z79899 Other long term (current) drug therapy: Secondary | ICD-10-CM | POA: Diagnosis not present

## 2020-10-23 DIAGNOSIS — M79643 Pain in unspecified hand: Secondary | ICD-10-CM | POA: Diagnosis not present

## 2020-10-23 DIAGNOSIS — Z23 Encounter for immunization: Secondary | ICD-10-CM | POA: Diagnosis not present

## 2020-10-23 DIAGNOSIS — R110 Nausea: Secondary | ICD-10-CM | POA: Diagnosis not present

## 2020-10-23 DIAGNOSIS — L4050 Arthropathic psoriasis, unspecified: Secondary | ICD-10-CM | POA: Diagnosis not present

## 2020-11-05 DIAGNOSIS — M79671 Pain in right foot: Secondary | ICD-10-CM | POA: Diagnosis not present

## 2020-11-05 DIAGNOSIS — M7741 Metatarsalgia, right foot: Secondary | ICD-10-CM | POA: Diagnosis not present

## 2020-11-05 DIAGNOSIS — M2041 Other hammer toe(s) (acquired), right foot: Secondary | ICD-10-CM | POA: Diagnosis not present

## 2020-11-18 DIAGNOSIS — M79671 Pain in right foot: Secondary | ICD-10-CM | POA: Diagnosis not present

## 2020-11-26 DIAGNOSIS — M2041 Other hammer toe(s) (acquired), right foot: Secondary | ICD-10-CM | POA: Diagnosis not present

## 2020-11-26 DIAGNOSIS — M7741 Metatarsalgia, right foot: Secondary | ICD-10-CM | POA: Diagnosis not present

## 2020-12-26 DIAGNOSIS — Z79899 Other long term (current) drug therapy: Secondary | ICD-10-CM | POA: Diagnosis not present

## 2020-12-26 DIAGNOSIS — R110 Nausea: Secondary | ICD-10-CM | POA: Diagnosis not present

## 2020-12-26 DIAGNOSIS — L4050 Arthropathic psoriasis, unspecified: Secondary | ICD-10-CM | POA: Diagnosis not present

## 2020-12-26 DIAGNOSIS — M79643 Pain in unspecified hand: Secondary | ICD-10-CM | POA: Diagnosis not present

## 2021-01-01 DIAGNOSIS — M7741 Metatarsalgia, right foot: Secondary | ICD-10-CM | POA: Diagnosis not present

## 2021-01-01 DIAGNOSIS — M24274 Disorder of ligament, right foot: Secondary | ICD-10-CM | POA: Diagnosis not present

## 2021-01-01 DIAGNOSIS — M2041 Other hammer toe(s) (acquired), right foot: Secondary | ICD-10-CM | POA: Diagnosis not present

## 2021-01-17 DIAGNOSIS — L4050 Arthropathic psoriasis, unspecified: Secondary | ICD-10-CM | POA: Diagnosis not present

## 2021-01-31 DIAGNOSIS — L4050 Arthropathic psoriasis, unspecified: Secondary | ICD-10-CM | POA: Diagnosis not present

## 2021-02-08 IMAGING — CT CT HEAD W/O CM
3 series · 16 of 47 positions shown, 19 images · non-contrast
Comparison: Brain CT 06/30/2003.

CLINICAL DATA: Patient with fall on [REDACTED].

EXAM:
CT HEAD WITHOUT CONTRAST
TECHNIQUE: Contiguous axial images were obtained from the base of the skull
through the vertex without intravenous contrast.

[Series 2: head wo · axial · 0.44mm/px · z∈[-81,+44]mm · 10 of 30 slices shown, 13 images]
[im 3/30  brain]
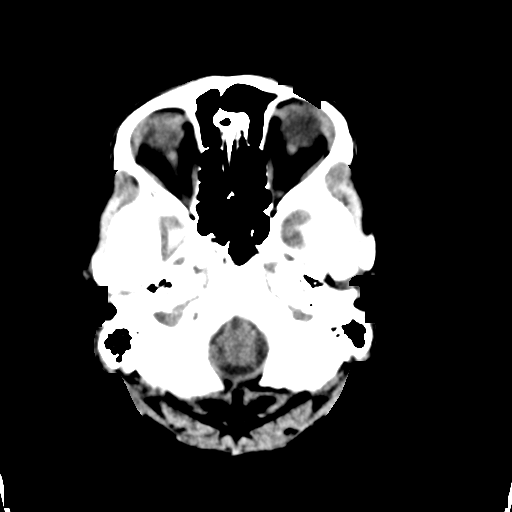
[im 3/30  bone]
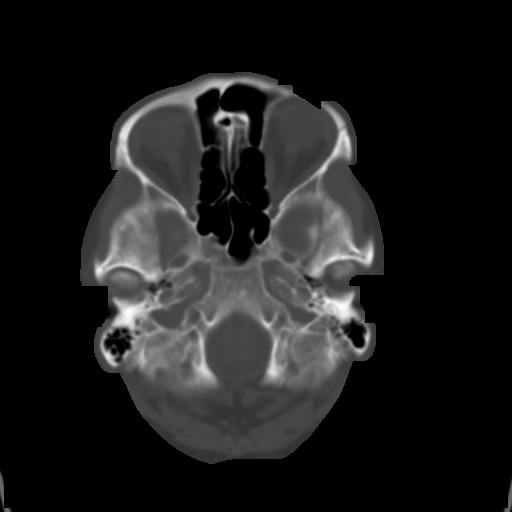
[im 6/30  brain]
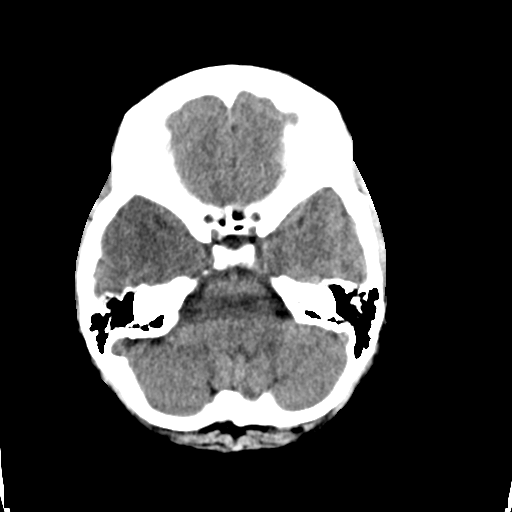
[im 9/30  brain]
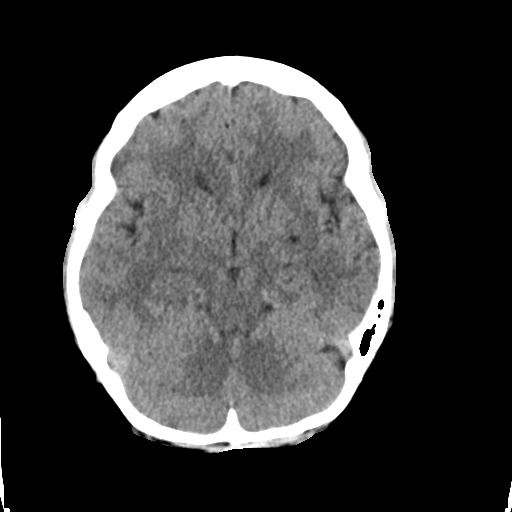
[im 11/30  brain]
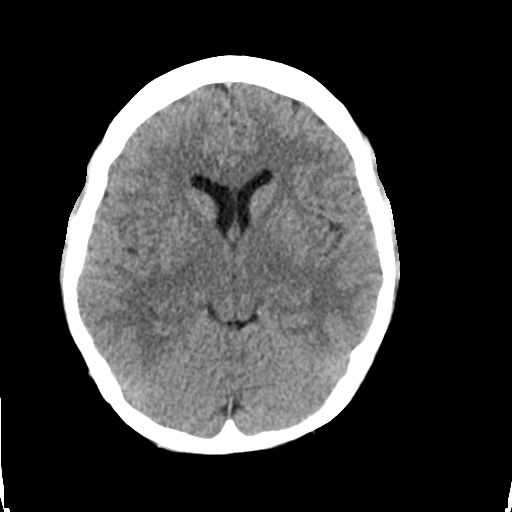
[im 14/30  brain]
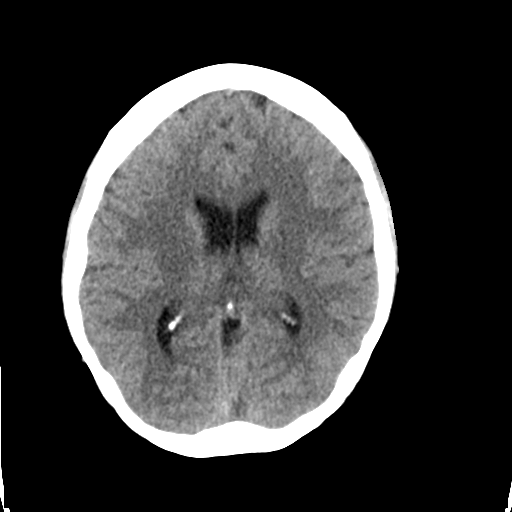
[im 14/30  bone]
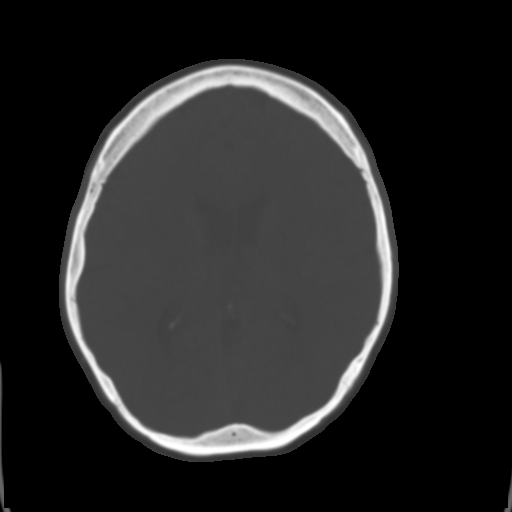
[im 17/30  brain]
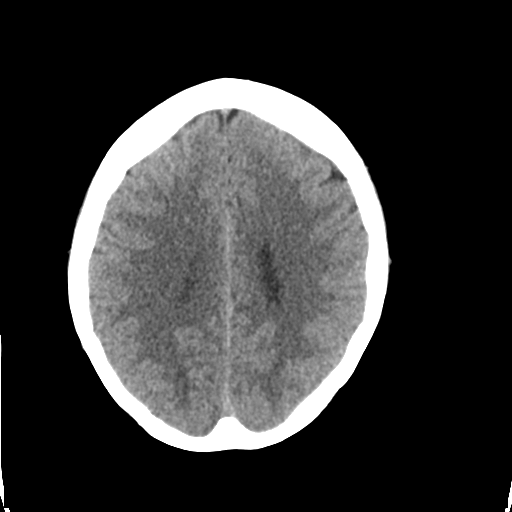
[im 20/30  brain]
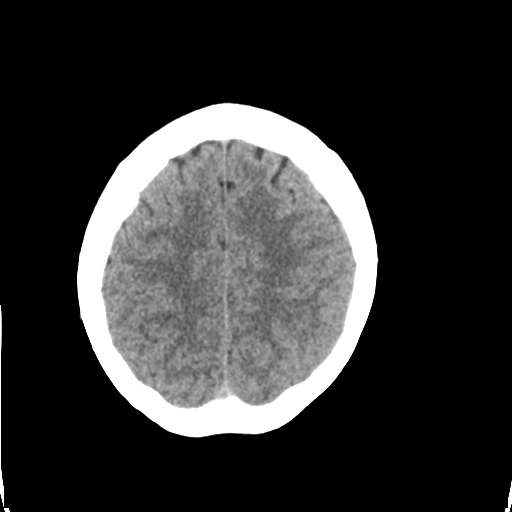
[im 23/30  brain]
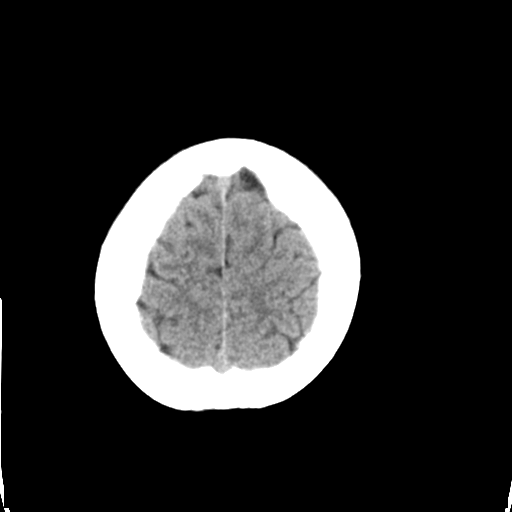
[im 25/30  brain]
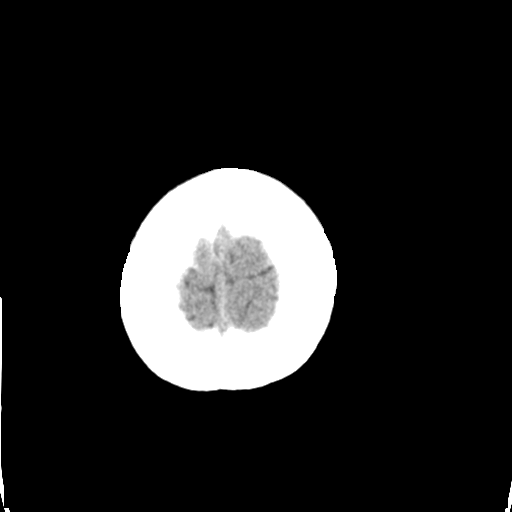
[im 25/30  bone]
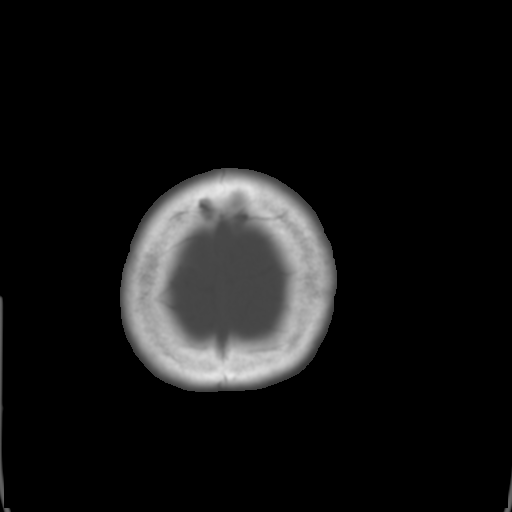
[im 28/30  brain]
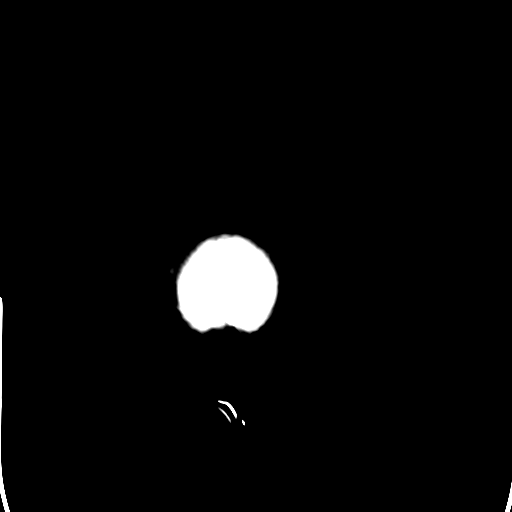

[Series 4: coronal soft tissue · coronal · 0.33mm/px · 3 of 65 slices shown]
[im 22/65  brain]
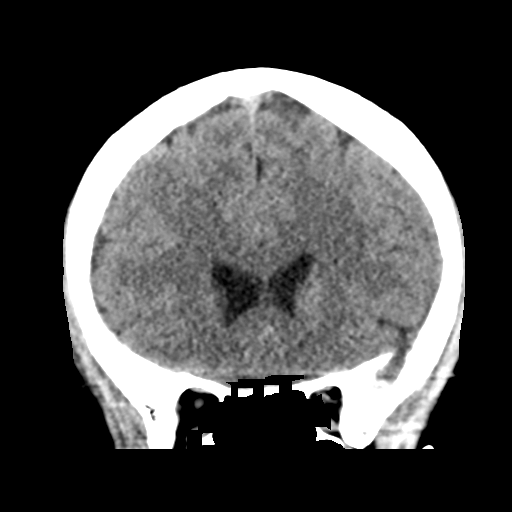
[im 29/65  brain]
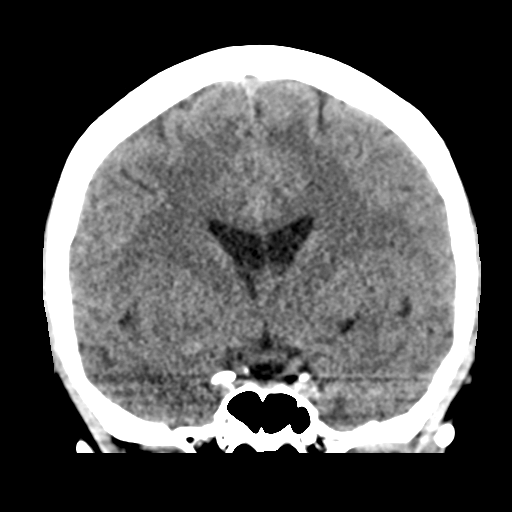
[im 36/65  brain]
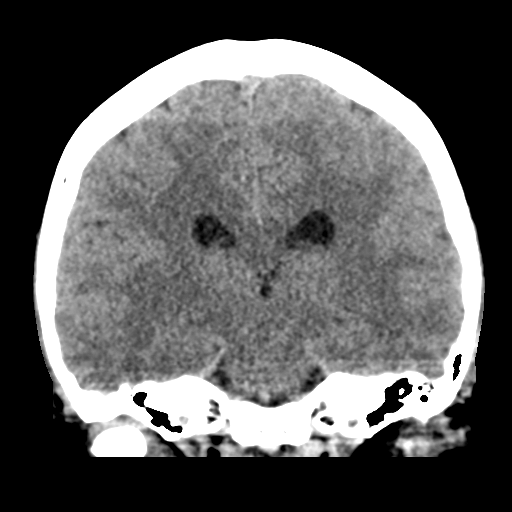

[Series 5: sagittal soft tissue · sagittal · 0.31mm/px · 3 of 59 slices shown]
[im 20/59  brain]
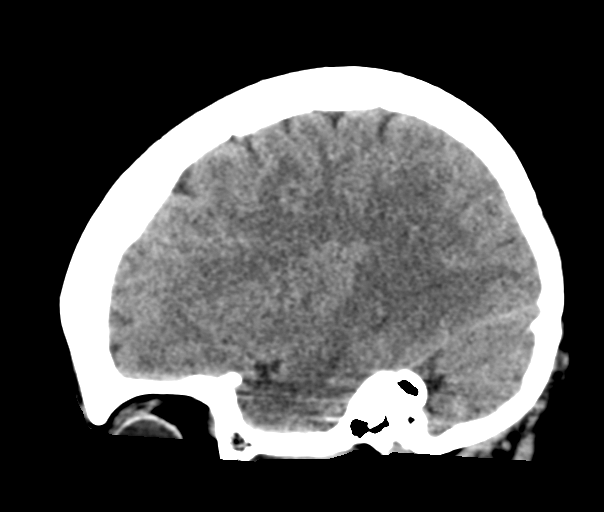
[im 30/59  brain]
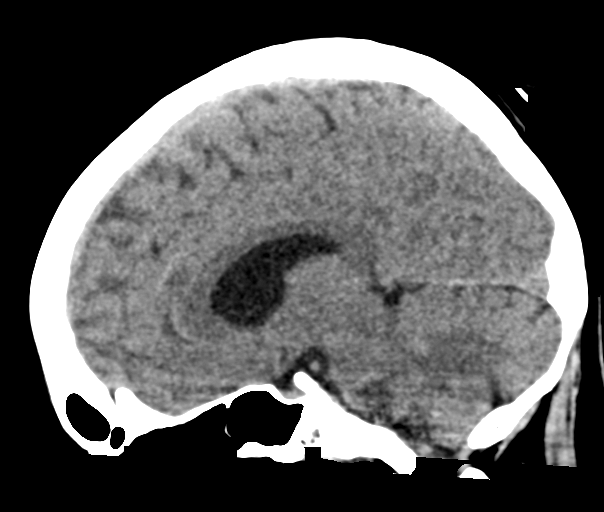
[im 39/59  brain]
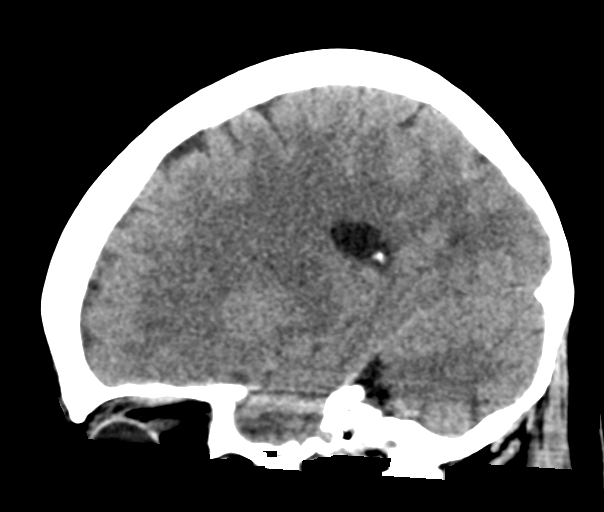

[16 of 47 positions shown; findings below may reference images not displayed]

FINDINGS: Brain: Ventricles and sulci are appropriate for patient's age. No
evidence for acute cortically based infarct, intracranial
hemorrhage, mass lesion or mass-effect.

Vascular: Unremarkable

Skull: Intact.

Sinuses/Orbits: Paranasal sinuses are well aerated. Mastoid air
cells are unremarkable.

Other: None.
IMPRESSION: No acute intracranial process.

## 2021-02-14 DIAGNOSIS — L4050 Arthropathic psoriasis, unspecified: Secondary | ICD-10-CM | POA: Diagnosis not present

## 2021-02-15 DIAGNOSIS — M2041 Other hammer toe(s) (acquired), right foot: Secondary | ICD-10-CM | POA: Diagnosis not present

## 2021-03-14 DIAGNOSIS — L4050 Arthropathic psoriasis, unspecified: Secondary | ICD-10-CM | POA: Diagnosis not present

## 2021-03-15 DIAGNOSIS — M79671 Pain in right foot: Secondary | ICD-10-CM | POA: Diagnosis not present

## 2021-03-26 DIAGNOSIS — L4050 Arthropathic psoriasis, unspecified: Secondary | ICD-10-CM | POA: Diagnosis not present

## 2021-03-26 DIAGNOSIS — M79643 Pain in unspecified hand: Secondary | ICD-10-CM | POA: Diagnosis not present

## 2021-03-26 DIAGNOSIS — R110 Nausea: Secondary | ICD-10-CM | POA: Diagnosis not present

## 2021-03-26 DIAGNOSIS — M25552 Pain in left hip: Secondary | ICD-10-CM | POA: Diagnosis not present

## 2021-03-26 DIAGNOSIS — Z79899 Other long term (current) drug therapy: Secondary | ICD-10-CM | POA: Diagnosis not present

## 2021-04-11 DIAGNOSIS — Z8632 Personal history of gestational diabetes: Secondary | ICD-10-CM | POA: Diagnosis not present

## 2021-04-11 DIAGNOSIS — E7800 Pure hypercholesterolemia, unspecified: Secondary | ICD-10-CM | POA: Diagnosis not present

## 2021-04-11 DIAGNOSIS — E559 Vitamin D deficiency, unspecified: Secondary | ICD-10-CM | POA: Diagnosis not present

## 2021-04-11 DIAGNOSIS — L4050 Arthropathic psoriasis, unspecified: Secondary | ICD-10-CM | POA: Diagnosis not present

## 2021-04-11 DIAGNOSIS — R946 Abnormal results of thyroid function studies: Secondary | ICD-10-CM | POA: Diagnosis not present

## 2021-04-18 DIAGNOSIS — E7800 Pure hypercholesterolemia, unspecified: Secondary | ICD-10-CM | POA: Diagnosis not present

## 2021-04-18 DIAGNOSIS — Z1211 Encounter for screening for malignant neoplasm of colon: Secondary | ICD-10-CM | POA: Diagnosis not present

## 2021-04-18 DIAGNOSIS — Z0000 Encounter for general adult medical examination without abnormal findings: Secondary | ICD-10-CM | POA: Diagnosis not present

## 2021-04-18 DIAGNOSIS — Z13220 Encounter for screening for lipoid disorders: Secondary | ICD-10-CM | POA: Diagnosis not present

## 2021-05-01 ENCOUNTER — Ambulatory Visit (INDEPENDENT_AMBULATORY_CARE_PROVIDER_SITE_OTHER): Payer: BC Managed Care – PPO | Admitting: Dermatology

## 2021-05-01 ENCOUNTER — Encounter: Payer: Self-pay | Admitting: Dermatology

## 2021-05-01 DIAGNOSIS — L821 Other seborrheic keratosis: Secondary | ICD-10-CM

## 2021-05-01 DIAGNOSIS — Z1283 Encounter for screening for malignant neoplasm of skin: Secondary | ICD-10-CM | POA: Diagnosis not present

## 2021-05-01 DIAGNOSIS — L918 Other hypertrophic disorders of the skin: Secondary | ICD-10-CM | POA: Diagnosis not present

## 2021-05-01 DIAGNOSIS — D1801 Hemangioma of skin and subcutaneous tissue: Secondary | ICD-10-CM

## 2021-05-01 DIAGNOSIS — L851 Acquired keratosis [keratoderma] palmaris et plantaris: Secondary | ICD-10-CM

## 2021-05-09 DIAGNOSIS — L4050 Arthropathic psoriasis, unspecified: Secondary | ICD-10-CM | POA: Diagnosis not present

## 2021-05-19 ENCOUNTER — Encounter: Payer: Self-pay | Admitting: Dermatology

## 2021-05-19 NOTE — Progress Notes (Signed)
? ?  Follow-Up Visit ?  ?Subjective  ?Kathy Hunt is a 51 y.o. female who presents for the following: Annual Exam (Right cheek x months- "growing" & back per pcp). ? ?General skin examination, check spot on right cheek ?Location:  ?Duration:  ?Quality:  ?Associated Signs/Symptoms: ?Modifying Factors:  ?Severity:  ?Timing: ?Context:  ? ?Objective  ?Well appearing patient in no apparent distress; mood and affect are within normal limits. ?General skin examination: No atypical pigmented lesions (all checked with dermoscopy), no nonmelanoma skin cancer ? ?Generalized, Right Buccal Cheek ?Flat subtly textured monochrome light brown 4 mm lesion, no dermoscopic atypia ? ?Right Abdomen (side) - Upper ?Multiple 1 to 2 mm smooth red dermal papules ? ?Left Thigh - Anterior (2) ?4 mm flattopped keratotic papule ? ?Right Breast ?3 mm flesh-colored pedunculated papule ? ? ? ?A full examination was performed including scalp, head, eyes, ears, nose, lips, neck, chest, axillae, abdomen, back, buttocks, bilateral upper extremities, bilateral lower extremities, hands, feet, fingers, toes, fingernails, and toenails. All findings within normal limits unless otherwise noted below.  Beneath undergarments not fully examined ? ? ?Assessment & Plan  ? ? ?Screening for malignant neoplasm of skin ? ?Annual skin examination, and patient encouraged to self examine twice annually.  Continued ultraviolet protection. ? ?Hemangioma of skin ?Right Abdomen (side) - Upper ? ?No intervention necessary ? ?Seborrheic keratosis (2) ?Left Thigh - Anterior ? ?Leave if stable ? ?Skin tag ?Right Breast ? ?May choose future removal ? ?Stucco keratosis (2) ?Right Buccal Cheek; Generalized ? ?Recheck as needed change ? ?Destruction of lesion - Right Buccal Cheek ?Complexity: simple   ?Destruction method: cryotherapy   ?Informed consent: discussed and consent obtained   ?Timeout:  patient name, date of birth, surgical site, and procedure verified ?Lesion  destroyed using liquid nitrogen: Yes   ?Outcome: patient tolerated procedure well with no complications   ? ? ? ? ? ?I, Lavonna Monarch, MD, have reviewed all documentation for this visit.  The documentation on 05/19/21 for the exam, diagnosis, procedures, and orders are all accurate and complete. ?

## 2021-06-06 DIAGNOSIS — R110 Nausea: Secondary | ICD-10-CM | POA: Diagnosis not present

## 2021-06-06 DIAGNOSIS — Z79899 Other long term (current) drug therapy: Secondary | ICD-10-CM | POA: Diagnosis not present

## 2021-06-06 DIAGNOSIS — M79643 Pain in unspecified hand: Secondary | ICD-10-CM | POA: Diagnosis not present

## 2021-06-06 DIAGNOSIS — L4050 Arthropathic psoriasis, unspecified: Secondary | ICD-10-CM | POA: Diagnosis not present

## 2021-06-13 DIAGNOSIS — L4050 Arthropathic psoriasis, unspecified: Secondary | ICD-10-CM | POA: Diagnosis not present

## 2021-07-11 DIAGNOSIS — L4050 Arthropathic psoriasis, unspecified: Secondary | ICD-10-CM | POA: Diagnosis not present

## 2021-07-11 DIAGNOSIS — L52 Erythema nodosum: Secondary | ICD-10-CM | POA: Diagnosis not present

## 2021-08-08 DIAGNOSIS — L4050 Arthropathic psoriasis, unspecified: Secondary | ICD-10-CM | POA: Diagnosis not present

## 2021-08-15 DIAGNOSIS — Z1231 Encounter for screening mammogram for malignant neoplasm of breast: Secondary | ICD-10-CM | POA: Diagnosis not present

## 2021-09-05 DIAGNOSIS — L4050 Arthropathic psoriasis, unspecified: Secondary | ICD-10-CM | POA: Diagnosis not present

## 2021-09-17 DIAGNOSIS — Z79899 Other long term (current) drug therapy: Secondary | ICD-10-CM | POA: Diagnosis not present

## 2021-09-17 DIAGNOSIS — L4050 Arthropathic psoriasis, unspecified: Secondary | ICD-10-CM | POA: Diagnosis not present

## 2021-09-17 DIAGNOSIS — R110 Nausea: Secondary | ICD-10-CM | POA: Diagnosis not present

## 2021-09-17 DIAGNOSIS — M79643 Pain in unspecified hand: Secondary | ICD-10-CM | POA: Diagnosis not present

## 2021-10-03 DIAGNOSIS — L4050 Arthropathic psoriasis, unspecified: Secondary | ICD-10-CM | POA: Diagnosis not present

## 2021-10-16 DIAGNOSIS — D122 Benign neoplasm of ascending colon: Secondary | ICD-10-CM | POA: Diagnosis not present

## 2021-10-16 DIAGNOSIS — K648 Other hemorrhoids: Secondary | ICD-10-CM | POA: Diagnosis not present

## 2021-10-16 DIAGNOSIS — Z1211 Encounter for screening for malignant neoplasm of colon: Secondary | ICD-10-CM | POA: Diagnosis not present

## 2021-10-31 DIAGNOSIS — L4050 Arthropathic psoriasis, unspecified: Secondary | ICD-10-CM | POA: Diagnosis not present

## 2021-12-05 DIAGNOSIS — L4050 Arthropathic psoriasis, unspecified: Secondary | ICD-10-CM | POA: Diagnosis not present

## 2021-12-17 DIAGNOSIS — Z79899 Other long term (current) drug therapy: Secondary | ICD-10-CM | POA: Diagnosis not present

## 2021-12-17 DIAGNOSIS — L4050 Arthropathic psoriasis, unspecified: Secondary | ICD-10-CM | POA: Diagnosis not present

## 2021-12-17 DIAGNOSIS — Z23 Encounter for immunization: Secondary | ICD-10-CM | POA: Diagnosis not present

## 2021-12-17 DIAGNOSIS — M79643 Pain in unspecified hand: Secondary | ICD-10-CM | POA: Diagnosis not present

## 2021-12-17 DIAGNOSIS — R110 Nausea: Secondary | ICD-10-CM | POA: Diagnosis not present

## 2022-01-02 DIAGNOSIS — L4050 Arthropathic psoriasis, unspecified: Secondary | ICD-10-CM | POA: Diagnosis not present

## 2022-01-30 DIAGNOSIS — Z79899 Other long term (current) drug therapy: Secondary | ICD-10-CM | POA: Diagnosis not present

## 2022-01-30 DIAGNOSIS — L4050 Arthropathic psoriasis, unspecified: Secondary | ICD-10-CM | POA: Diagnosis not present

## 2022-02-06 DIAGNOSIS — D2239 Melanocytic nevi of other parts of face: Secondary | ICD-10-CM | POA: Diagnosis not present

## 2022-02-27 DIAGNOSIS — L4050 Arthropathic psoriasis, unspecified: Secondary | ICD-10-CM | POA: Diagnosis not present

## 2022-03-18 DIAGNOSIS — Z79899 Other long term (current) drug therapy: Secondary | ICD-10-CM | POA: Diagnosis not present

## 2022-03-18 DIAGNOSIS — L609 Nail disorder, unspecified: Secondary | ICD-10-CM | POA: Diagnosis not present

## 2022-03-18 DIAGNOSIS — M25551 Pain in right hip: Secondary | ICD-10-CM | POA: Diagnosis not present

## 2022-03-18 DIAGNOSIS — R700 Elevated erythrocyte sedimentation rate: Secondary | ICD-10-CM | POA: Diagnosis not present

## 2022-03-18 DIAGNOSIS — M25552 Pain in left hip: Secondary | ICD-10-CM | POA: Diagnosis not present

## 2022-03-18 DIAGNOSIS — L4050 Arthropathic psoriasis, unspecified: Secondary | ICD-10-CM | POA: Diagnosis not present

## 2022-03-18 DIAGNOSIS — M2569 Stiffness of other specified joint, not elsewhere classified: Secondary | ICD-10-CM | POA: Diagnosis not present

## 2022-03-18 DIAGNOSIS — R202 Paresthesia of skin: Secondary | ICD-10-CM | POA: Diagnosis not present

## 2022-03-27 DIAGNOSIS — L4050 Arthropathic psoriasis, unspecified: Secondary | ICD-10-CM | POA: Diagnosis not present

## 2022-04-17 DIAGNOSIS — E7800 Pure hypercholesterolemia, unspecified: Secondary | ICD-10-CM | POA: Diagnosis not present

## 2022-04-17 DIAGNOSIS — R946 Abnormal results of thyroid function studies: Secondary | ICD-10-CM | POA: Diagnosis not present

## 2022-04-17 DIAGNOSIS — Z8632 Personal history of gestational diabetes: Secondary | ICD-10-CM | POA: Diagnosis not present

## 2022-04-24 DIAGNOSIS — E7800 Pure hypercholesterolemia, unspecified: Secondary | ICD-10-CM | POA: Diagnosis not present

## 2022-04-24 DIAGNOSIS — Z0000 Encounter for general adult medical examination without abnormal findings: Secondary | ICD-10-CM | POA: Diagnosis not present

## 2022-04-25 ENCOUNTER — Other Ambulatory Visit (HOSPITAL_COMMUNITY): Payer: Self-pay | Admitting: Family Medicine

## 2022-04-25 DIAGNOSIS — E7800 Pure hypercholesterolemia, unspecified: Secondary | ICD-10-CM

## 2022-05-01 DIAGNOSIS — L4050 Arthropathic psoriasis, unspecified: Secondary | ICD-10-CM | POA: Diagnosis not present

## 2022-05-05 ENCOUNTER — Ambulatory Visit: Payer: BC Managed Care – PPO | Admitting: Dermatology

## 2022-05-13 ENCOUNTER — Encounter (HOSPITAL_COMMUNITY): Payer: Self-pay

## 2022-05-13 ENCOUNTER — Ambulatory Visit (HOSPITAL_COMMUNITY)
Admission: RE | Admit: 2022-05-13 | Discharge: 2022-05-13 | Disposition: A | Discharge status: Home / Self Care | Payer: BC Managed Care – PPO | Source: Ambulatory Visit | Attending: Family Medicine | Admitting: Family Medicine

## 2022-05-13 DIAGNOSIS — E7800 Pure hypercholesterolemia, unspecified: Secondary | ICD-10-CM | POA: Insufficient documentation

## 2022-05-28 DIAGNOSIS — R200 Anesthesia of skin: Secondary | ICD-10-CM | POA: Diagnosis not present

## 2022-05-28 DIAGNOSIS — R2689 Other abnormalities of gait and mobility: Secondary | ICD-10-CM | POA: Diagnosis not present

## 2022-05-28 DIAGNOSIS — R531 Weakness: Secondary | ICD-10-CM | POA: Diagnosis not present

## 2022-05-28 DIAGNOSIS — R202 Paresthesia of skin: Secondary | ICD-10-CM | POA: Diagnosis not present

## 2022-05-29 DIAGNOSIS — L4050 Arthropathic psoriasis, unspecified: Secondary | ICD-10-CM | POA: Diagnosis not present

## 2022-06-09 DIAGNOSIS — R202 Paresthesia of skin: Secondary | ICD-10-CM | POA: Diagnosis not present

## 2022-06-09 DIAGNOSIS — R200 Anesthesia of skin: Secondary | ICD-10-CM | POA: Diagnosis not present

## 2022-06-24 DIAGNOSIS — E559 Vitamin D deficiency, unspecified: Secondary | ICD-10-CM | POA: Diagnosis not present

## 2022-06-24 DIAGNOSIS — Z79899 Other long term (current) drug therapy: Secondary | ICD-10-CM | POA: Diagnosis not present

## 2022-06-24 DIAGNOSIS — L609 Nail disorder, unspecified: Secondary | ICD-10-CM | POA: Diagnosis not present

## 2022-06-24 DIAGNOSIS — R700 Elevated erythrocyte sedimentation rate: Secondary | ICD-10-CM | POA: Diagnosis not present

## 2022-06-24 DIAGNOSIS — L4050 Arthropathic psoriasis, unspecified: Secondary | ICD-10-CM | POA: Diagnosis not present

## 2022-06-26 DIAGNOSIS — L4050 Arthropathic psoriasis, unspecified: Secondary | ICD-10-CM | POA: Diagnosis not present

## 2022-07-24 DIAGNOSIS — L4050 Arthropathic psoriasis, unspecified: Secondary | ICD-10-CM | POA: Diagnosis not present

## 2022-08-21 DIAGNOSIS — Z1231 Encounter for screening mammogram for malignant neoplasm of breast: Secondary | ICD-10-CM | POA: Diagnosis not present

## 2022-08-25 DIAGNOSIS — L4050 Arthropathic psoriasis, unspecified: Secondary | ICD-10-CM | POA: Diagnosis not present

## 2022-09-10 ENCOUNTER — Emergency Department (HOSPITAL_BASED_OUTPATIENT_CLINIC_OR_DEPARTMENT_OTHER)
Admission: EM | Admit: 2022-09-10 | Discharge: 2022-09-10 | Disposition: A | Discharge status: Home / Self Care | Payer: BC Managed Care – PPO | Attending: Emergency Medicine | Admitting: Emergency Medicine

## 2022-09-10 ENCOUNTER — Emergency Department (HOSPITAL_BASED_OUTPATIENT_CLINIC_OR_DEPARTMENT_OTHER): Payer: BC Managed Care – PPO

## 2022-09-10 ENCOUNTER — Other Ambulatory Visit: Payer: Self-pay

## 2022-09-10 ENCOUNTER — Encounter (HOSPITAL_BASED_OUTPATIENT_CLINIC_OR_DEPARTMENT_OTHER): Payer: Self-pay | Admitting: Emergency Medicine

## 2022-09-10 DIAGNOSIS — L4052 Psoriatic arthritis mutilans: Secondary | ICD-10-CM | POA: Diagnosis not present

## 2022-09-10 DIAGNOSIS — D72829 Elevated white blood cell count, unspecified: Secondary | ICD-10-CM | POA: Diagnosis not present

## 2022-09-10 DIAGNOSIS — R079 Chest pain, unspecified: Secondary | ICD-10-CM | POA: Diagnosis not present

## 2022-09-10 DIAGNOSIS — R0789 Other chest pain: Secondary | ICD-10-CM | POA: Diagnosis not present

## 2022-09-10 DIAGNOSIS — L4050 Arthropathic psoriasis, unspecified: Secondary | ICD-10-CM | POA: Insufficient documentation

## 2022-09-10 DIAGNOSIS — E876 Hypokalemia: Secondary | ICD-10-CM | POA: Diagnosis not present

## 2022-09-10 DIAGNOSIS — R5383 Other fatigue: Secondary | ICD-10-CM | POA: Diagnosis not present

## 2022-09-10 DIAGNOSIS — R5381 Other malaise: Secondary | ICD-10-CM | POA: Diagnosis not present

## 2022-09-10 LAB — CBC WITH DIFFERENTIAL/PLATELET
Abs Immature Granulocytes: 0.03 10*3/uL (ref 0.00–0.07)
Basophils Absolute: 0 10*3/uL (ref 0.0–0.1)
Basophils Relative: 0 %
Eosinophils Absolute: 0.1 10*3/uL (ref 0.0–0.5)
Eosinophils Relative: 1 %
HCT: 41.2 % (ref 36.0–46.0)
Hemoglobin: 14.3 g/dL (ref 12.0–15.0)
Immature Granulocytes: 0 %
Lymphocytes Relative: 30 %
Lymphs Abs: 3.4 10*3/uL (ref 0.7–4.0)
MCH: 34 pg (ref 26.0–34.0)
MCHC: 34.7 g/dL (ref 30.0–36.0)
MCV: 97.9 fL (ref 80.0–100.0)
Monocytes Absolute: 0.5 10*3/uL (ref 0.1–1.0)
Monocytes Relative: 5 %
Neutro Abs: 7.1 10*3/uL (ref 1.7–7.7)
Neutrophils Relative %: 64 %
Platelets: 264 10*3/uL (ref 150–400)
RBC: 4.21 MIL/uL (ref 3.87–5.11)
RDW: 12.7 % (ref 11.5–15.5)
WBC: 11.2 10*3/uL — ABNORMAL HIGH (ref 4.0–10.5)
nRBC: 0 % (ref 0.0–0.2)

## 2022-09-10 LAB — COMPREHENSIVE METABOLIC PANEL
ALT: 12 U/L (ref 0–44)
AST: 15 U/L (ref 15–41)
Albumin: 4.6 g/dL (ref 3.5–5.0)
Alkaline Phosphatase: 61 U/L (ref 38–126)
Anion gap: 9 (ref 5–15)
BUN: 11 mg/dL (ref 6–20)
CO2: 25 mmol/L (ref 22–32)
Calcium: 9.2 mg/dL (ref 8.9–10.3)
Chloride: 106 mmol/L (ref 98–111)
Creatinine, Ser: 0.55 mg/dL (ref 0.44–1.00)
GFR, Estimated: >60 mL/min (ref >60)
Glucose, Bld: 77 mg/dL (ref 70–99)
Potassium: 3.4 mmol/L — ABNORMAL LOW (ref 3.5–5.1)
Sodium: 140 mmol/L (ref 135–145)
Total Bilirubin: 0.8 mg/dL (ref 0.3–1.2)
Total Protein: 7.4 g/dL (ref 6.5–8.1)

## 2022-09-10 LAB — URINALYSIS, ROUTINE W REFLEX MICROSCOPIC
Bilirubin Urine: NEGATIVE
Glucose, UA: NEGATIVE mg/dL
Ketones, ur: NEGATIVE mg/dL
Leukocytes,Ua: NEGATIVE
Nitrite: NEGATIVE
Protein, ur: NEGATIVE mg/dL
Specific Gravity, Urine: 1.01 (ref 1.005–1.030)
pH: 6 (ref 5.0–8.0)

## 2022-09-10 LAB — TROPONIN I (HIGH SENSITIVITY): Troponin I (High Sensitivity): <2 ng/L (ref <18)

## 2022-09-10 MED ORDER — KETOROLAC TROMETHAMINE 15 MG/ML IJ SOLN
15.0000 mg | Freq: Once | INTRAMUSCULAR | Status: AC
  Administered 2022-09-10: 15 mg via INTRAVENOUS
  Filled 2022-09-10: qty 1

## 2022-09-10 MED ORDER — PREDNISONE 50 MG PO TABS
60.0000 mg | ORAL_TABLET | Freq: Once | ORAL | Status: AC
  Administered 2022-09-10: 60 mg via ORAL
  Filled 2022-09-10: qty 1

## 2022-09-10 MED ORDER — PREDNISONE 10 MG (21) PO TBPK: ORAL_TABLET | Freq: Every day | ORAL | 0 refills | Status: DC

## 2022-09-10 NOTE — ED Triage Notes (Signed)
Pt arrives to ED with c/o generalized fatigue, lethargy, and malaise x5 days. Pt also notes generalized body pain, hx PSA. Hx low Vit D.

## 2022-09-10 NOTE — ED Notes (Signed)
Dc instructions reviewed with patient. Patient voiced understanding. Dc with belongings.  °

## 2022-09-10 NOTE — Discharge Instructions (Addendum)
Despite the instructions on the steroid pack that I am prescribing I want you to skip the first dose because we are giving you a dose today in the emergency department.  Please follow-up closely with your rheumatologist and return to the emergency department if your symptoms worsen despite treatment.

## 2022-09-10 NOTE — ED Provider Notes (Signed)
[place] EMERGENCY DEPARTMENT AT [facility] Provider Note   CSN: [id number] Arrival date & time: 09/10/22  1356     History {Add pertinent medical, surgical, social history, OB history to HPI:1} Chief Complaint  Patient presents with   Fatigue    Kathy Hunt is a 52 y.o. female past medical history significant for psoriatic arthritis presents concern for generalized fatigue, lethargy, malaise, body aches for 5 days.  She reports that she has had similar symptoms before with critically low vitamin D.  Patient reports that she takes methotrexate as well as Cimzia for her psoriatic arthritis, was previously on chronic steroids but reports that she discontinued secondary to weight gain and other undesirable side effects.  She denies any chest pain, shortness of breath, cough, fever, chills, dysuria, nausea, vomiting.  She does endorse some chest pressure.  HPI     Home Medications Prior to Admission medications   Medication Sig Start Date End Date Taking? Authorizing Provider  acetaminophen (TYLENOL) 500 MG tablet Take 1,000 mg by mouth every 6 (six) hours as needed for moderate pain. Patient not taking: Reported on 05/01/2021    [provider]  certolizumab pegol (CIMZIA) 2 X 200 MG/ML PSKT Inject into the skin.    [provider]  certolizumab pegol (CIMZIA) 2 X 200 MG/ML PSKT See admin instructions.    [provider]  ibuprofen (ADVIL,MOTRIN) 800 MG tablet Take 1 tablet (800 mg total) by mouth every 8 (eight) hours as needed for moderate pain or cramping (Take with food.). Patient not taking: Reported on 05/01/2021 12/08/16   Lizbeth Bark, FNP  leucovorin (WELLCOVORIN) 5 MG tablet leucovorin calcium 5 mg tablet    [provider]  methocarbamol (ROBAXIN) 750 MG tablet Take 1 tablet (750 mg total) by mouth every 8 (eight) hours as needed for muscle spasms. Patient not taking: Reported on 05/01/2021 12/08/16   Lizbeth Bark,  FNP  methotrexate (RHEUMATREX) 2.5 MG tablet Take 2.5 mg by mouth once a week. Caution:Chemotherapy. Protect from light.- take 7 po qweek    [provider]  polyethylene glycol powder (GLYCOLAX/MIRALAX) powder Take 17 g by mouth daily. 11/04/16   Lizbeth Bark, FNP      Allergies    Penicillins    Review of Systems   Review of Systems  All other systems reviewed and are negative.   Physical Exam Updated Vital Signs BP 114/77   Pulse 64   Temp (!) 97 F (36.1 C)   Resp 20   Ht 5\' 1"  (1.549 m)   Wt 75.8 kg   SpO2 99%   BMI 31.55 kg/m  Physical Exam Vitals and nursing note reviewed.  Constitutional:      General: She is not in acute distress.    Appearance: Normal appearance.  HENT:     Head: Normocephalic and atraumatic.  Eyes:     General:        Right eye: No discharge.        Left eye: No discharge.  Cardiovascular:     Rate and Rhythm: Normal rate and regular rhythm.     Heart sounds: No murmur heard.    No friction rub. No gallop.     Comments: Some tenderness to palpation of the right chest wall Pulmonary:     Effort: Pulmonary effort is normal.     Breath sounds: Normal breath sounds.  Abdominal:     General: Bowel sounds are normal.  Palpations: Abdomen is soft.  Skin:    General: Skin is warm and dry.     Capillary Refill: Capillary refill takes less than 2 seconds.  Neurological:     Mental Status: She is alert and oriented to person, place, and time.  Psychiatric:        Mood and Affect: Mood normal.        Behavior: Behavior normal.     ED Results / Procedures / Treatments   Labs (all labs ordered are listed, but only abnormal results are displayed) Labs Reviewed  URINALYSIS, ROUTINE W REFLEX MICROSCOPIC - Abnormal; Notable for the following components:      Result Value   Color, Urine COLORLESS (*)    Hgb urine dipstick TRACE (*)    Bacteria, UA RARE (*)    All other components within normal limits  CBC WITH  DIFFERENTIAL/PLATELET - Abnormal; Notable for the following components:   WBC 11.2 (*)    All other components within normal limits  COMPREHENSIVE METABOLIC PANEL - Abnormal; Notable for the following components:   Potassium 3.4 (*)    All other components within normal limits  CBG MONITORING, ED  TROPONIN I (HIGH SENSITIVITY)  TROPONIN I (HIGH SENSITIVITY)    EKG EKG Interpretation Date/Time:  Wednesday September 10 2022 14:11:02 EDT Ventricular Rate:  70 PR Interval:  128 QRS Duration:  76 QT Interval:  384 QTC Calculation: 414 R Axis:   63  Text Interpretation: Normal sinus rhythm Normal ECG When compared with ECG of 26-Sep-2016 11:42, No significant change was found Confirmed by Edwin Dada ([id number]) on 09/10/2022 3:43:39 PM  Radiology No results found.  Procedures Procedures  {Document cardiac monitor, telemetry assessment procedure when appropriate:1}  Medications Ordered in ED Medications  ketorolac (TORADOL) 15 MG/ML injection 15 mg (15 mg Intravenous Given 09/10/22 1600)    ED Course/ Medical Decision Making/ A&P   {   Click here for ABCD2, HEART and other calculatorsREFRESH Note before signing :1}                              Medical Decision Making Amount and/or Complexity of Data Reviewed Labs: ordered. Radiology: ordered.  Risk Prescription drug management.   ***  {Document critical care time when appropriate:1} {Document review of labs and clinical decision tools ie heart score, Chads2Vasc2 etc:1}  {Document your independent review of radiology images, and any outside records:1} {Document your discussion with family members, caretakers, and with consultants:1} {Document social determinants of health affecting pt's care:1} {Document your decision making why or why not admission, treatments were needed:1} Final Clinical Impression(s) / ED Diagnoses Final diagnoses:  None    Rx / DC Orders ED Discharge Orders     None

## 2022-09-22 DIAGNOSIS — L4050 Arthropathic psoriasis, unspecified: Secondary | ICD-10-CM | POA: Diagnosis not present

## 2022-09-24 DIAGNOSIS — L609 Nail disorder, unspecified: Secondary | ICD-10-CM | POA: Diagnosis not present

## 2022-09-24 DIAGNOSIS — Z79899 Other long term (current) drug therapy: Secondary | ICD-10-CM | POA: Diagnosis not present

## 2022-09-24 DIAGNOSIS — M7989 Other specified soft tissue disorders: Secondary | ICD-10-CM | POA: Diagnosis not present

## 2022-09-24 DIAGNOSIS — L4050 Arthropathic psoriasis, unspecified: Secondary | ICD-10-CM | POA: Diagnosis not present

## 2022-10-02 ENCOUNTER — Telehealth: Payer: Self-pay

## 2022-10-02 NOTE — Telephone Encounter (Signed)
Transition Care Management Follow-up Telephone Call Date of discharge and from where: 09/10/2022 [facility] How have you been since you were released from the hospital? Patient stated she is feeling better but still has body aches and fatigue. Any questions or concerns? No  Items Reviewed: Did the pt receive and understand the discharge instructions provided? Yes  Medications obtained and verified? Yes  Other? No  Any new allergies since your discharge? No  Dietary orders reviewed? Yes Do you have support at home? Yes   Follow up appointments reviewed:  PCP Hospital f/u appt confirmed?  Patient stated she has followed up with her PCP.  Scheduled to see  on  @ . Specialist Hospital f/u appt confirmed? No  Scheduled to see  on  @ . Are transportation arrangements needed? No  If their condition worsens, is the pt aware to call PCP or go to the Emergency Dept.? Yes Was the patient provided with contact information for the PCP's office or ED? Yes Was to pt encouraged to call back with questions or concerns? Yes  Kathy Hunt [facility]  [facility], [facility] Guide Direct Dial: [phone]  Website: [email or web address]

## 2022-10-21 DIAGNOSIS — L4050 Arthropathic psoriasis, unspecified: Secondary | ICD-10-CM | POA: Diagnosis not present

## 2022-11-04 DIAGNOSIS — L4050 Arthropathic psoriasis, unspecified: Secondary | ICD-10-CM | POA: Diagnosis not present

## 2022-11-18 DIAGNOSIS — L4050 Arthropathic psoriasis, unspecified: Secondary | ICD-10-CM | POA: Diagnosis not present

## 2022-12-22 DIAGNOSIS — L4050 Arthropathic psoriasis, unspecified: Secondary | ICD-10-CM | POA: Diagnosis not present

## 2023-01-13 DIAGNOSIS — G4700 Insomnia, unspecified: Secondary | ICD-10-CM | POA: Diagnosis not present

## 2023-01-13 DIAGNOSIS — F4321 Adjustment disorder with depressed mood: Secondary | ICD-10-CM | POA: Diagnosis not present

## 2023-01-13 DIAGNOSIS — F418 Other specified anxiety disorders: Secondary | ICD-10-CM | POA: Diagnosis not present

## 2023-01-22 DIAGNOSIS — F411 Generalized anxiety disorder: Secondary | ICD-10-CM | POA: Diagnosis not present

## 2023-01-22 DIAGNOSIS — F339 Major depressive disorder, recurrent, unspecified: Secondary | ICD-10-CM | POA: Diagnosis not present

## 2023-01-26 DIAGNOSIS — L4050 Arthropathic psoriasis, unspecified: Secondary | ICD-10-CM | POA: Diagnosis not present

## 2023-02-03 DIAGNOSIS — F32A Depression, unspecified: Secondary | ICD-10-CM | POA: Diagnosis not present

## 2023-02-10 DIAGNOSIS — F32A Depression, unspecified: Secondary | ICD-10-CM | POA: Diagnosis not present

## 2023-02-10 DIAGNOSIS — Z79899 Other long term (current) drug therapy: Secondary | ICD-10-CM | POA: Diagnosis not present

## 2023-02-10 DIAGNOSIS — M7989 Other specified soft tissue disorders: Secondary | ICD-10-CM | POA: Diagnosis not present

## 2023-02-10 DIAGNOSIS — L4050 Arthropathic psoriasis, unspecified: Secondary | ICD-10-CM | POA: Diagnosis not present

## 2023-02-10 DIAGNOSIS — L609 Nail disorder, unspecified: Secondary | ICD-10-CM | POA: Diagnosis not present

## 2023-02-17 DIAGNOSIS — F329 Major depressive disorder, single episode, unspecified: Secondary | ICD-10-CM | POA: Diagnosis not present

## 2023-02-17 DIAGNOSIS — G4700 Insomnia, unspecified: Secondary | ICD-10-CM | POA: Diagnosis not present

## 2023-02-17 DIAGNOSIS — F32A Depression, unspecified: Secondary | ICD-10-CM | POA: Diagnosis not present

## 2023-02-17 DIAGNOSIS — M797 Fibromyalgia: Secondary | ICD-10-CM | POA: Diagnosis not present

## 2023-02-24 DIAGNOSIS — F32A Depression, unspecified: Secondary | ICD-10-CM | POA: Diagnosis not present

## 2023-03-03 DIAGNOSIS — L4050 Arthropathic psoriasis, unspecified: Secondary | ICD-10-CM | POA: Diagnosis not present

## 2023-03-10 DIAGNOSIS — F32A Depression, unspecified: Secondary | ICD-10-CM | POA: Diagnosis not present

## 2023-03-31 DIAGNOSIS — L4050 Arthropathic psoriasis, unspecified: Secondary | ICD-10-CM | POA: Diagnosis not present

## 2023-04-20 DIAGNOSIS — E785 Hyperlipidemia, unspecified: Secondary | ICD-10-CM | POA: Diagnosis not present

## 2023-04-20 DIAGNOSIS — R7309 Other abnormal glucose: Secondary | ICD-10-CM | POA: Diagnosis not present

## 2023-04-20 DIAGNOSIS — R946 Abnormal results of thyroid function studies: Secondary | ICD-10-CM | POA: Diagnosis not present

## 2023-04-27 DIAGNOSIS — F329 Major depressive disorder, single episode, unspecified: Secondary | ICD-10-CM | POA: Diagnosis not present

## 2023-04-27 DIAGNOSIS — Z79899 Other long term (current) drug therapy: Secondary | ICD-10-CM | POA: Diagnosis not present

## 2023-04-27 DIAGNOSIS — Z0000 Encounter for general adult medical examination without abnormal findings: Secondary | ICD-10-CM | POA: Diagnosis not present

## 2023-04-27 DIAGNOSIS — E7800 Pure hypercholesterolemia, unspecified: Secondary | ICD-10-CM | POA: Diagnosis not present

## 2023-04-28 DIAGNOSIS — L4050 Arthropathic psoriasis, unspecified: Secondary | ICD-10-CM | POA: Diagnosis not present

## 2023-05-26 DIAGNOSIS — L4050 Arthropathic psoriasis, unspecified: Secondary | ICD-10-CM | POA: Diagnosis not present

## 2023-06-08 ENCOUNTER — Emergency Department (HOSPITAL_COMMUNITY)

## 2023-06-08 ENCOUNTER — Emergency Department (HOSPITAL_COMMUNITY)
Admission: EM | Admit: 2023-06-08 | Discharge: 2023-06-08 | Disposition: A | Discharge status: Home / Self Care | Attending: Emergency Medicine | Admitting: Emergency Medicine

## 2023-06-08 ENCOUNTER — Other Ambulatory Visit: Payer: Self-pay

## 2023-06-08 ENCOUNTER — Encounter (HOSPITAL_COMMUNITY): Payer: Self-pay

## 2023-06-08 DIAGNOSIS — R42 Dizziness and giddiness: Secondary | ICD-10-CM | POA: Diagnosis not present

## 2023-06-08 DIAGNOSIS — R110 Nausea: Secondary | ICD-10-CM | POA: Diagnosis not present

## 2023-06-08 DIAGNOSIS — R55 Syncope and collapse: Secondary | ICD-10-CM | POA: Diagnosis not present

## 2023-06-08 DIAGNOSIS — R531 Weakness: Secondary | ICD-10-CM | POA: Diagnosis not present

## 2023-06-08 DIAGNOSIS — R29818 Other symptoms and signs involving the nervous system: Secondary | ICD-10-CM | POA: Diagnosis not present

## 2023-06-08 DIAGNOSIS — I959 Hypotension, unspecified: Secondary | ICD-10-CM | POA: Diagnosis not present

## 2023-06-08 LAB — COMPREHENSIVE METABOLIC PANEL WITH GFR
ALT: 19 U/L (ref 0–44)
AST: 23 U/L (ref 15–41)
Albumin: 4.3 g/dL (ref 3.5–5.0)
Alkaline Phosphatase: 66 U/L (ref 38–126)
Anion gap: 10 (ref 5–15)
BUN: 13 mg/dL (ref 6–20)
CO2: 22 mmol/L (ref 22–32)
Calcium: 9.4 mg/dL (ref 8.9–10.3)
Chloride: 104 mmol/L (ref 98–111)
Creatinine, Ser: 0.6 mg/dL (ref 0.44–1.00)
GFR, Estimated: >60 mL/min (ref >60)
Glucose, Bld: 88 mg/dL (ref 70–99)
Potassium: 4 mmol/L (ref 3.5–5.1)
Sodium: 136 mmol/L (ref 135–145)
Total Bilirubin: 1 mg/dL (ref 0.0–1.2)
Total Protein: 7.8 g/dL (ref 6.5–8.1)

## 2023-06-08 LAB — URINALYSIS, ROUTINE W REFLEX MICROSCOPIC
Bilirubin Urine: NEGATIVE
Glucose, UA: NEGATIVE mg/dL
Hgb urine dipstick: NEGATIVE
Ketones, ur: 5 mg/dL — AB
Leukocytes,Ua: NEGATIVE
Nitrite: NEGATIVE
Protein, ur: NEGATIVE mg/dL
Specific Gravity, Urine: 1.009 (ref 1.005–1.030)
pH: 6 (ref 5.0–8.0)

## 2023-06-08 LAB — CBC
HCT: 40.2 % (ref 36.0–46.0)
Hemoglobin: 13.6 g/dL (ref 12.0–15.0)
MCH: 34 pg (ref 26.0–34.0)
MCHC: 33.8 g/dL (ref 30.0–36.0)
MCV: 100.5 fL — ABNORMAL HIGH (ref 80.0–100.0)
Platelets: 264 10*3/uL (ref 150–400)
RBC: 4 MIL/uL (ref 3.87–5.11)
RDW: 13.2 % (ref 11.5–15.5)
WBC: 9.1 10*3/uL (ref 4.0–10.5)
nRBC: 0 % (ref 0.0–0.2)

## 2023-06-08 LAB — CBG MONITORING, ED: Glucose-Capillary: 101 mg/dL — ABNORMAL HIGH (ref 70–99)

## 2023-06-08 LAB — TROPONIN I (HIGH SENSITIVITY)
Troponin I (High Sensitivity): <2 ng/L (ref <18)
Troponin I (High Sensitivity): <2 ng/L (ref <18)

## 2023-06-08 MED ORDER — ONDANSETRON HCL 4 MG/2ML IJ SOLN
4.0000 mg | Freq: Once | INTRAMUSCULAR | Status: AC
  Administered 2023-06-08: 4 mg via INTRAVENOUS
  Filled 2023-06-08: qty 2

## 2023-06-08 MED ORDER — DEXAMETHASONE SODIUM PHOSPHATE 10 MG/ML IJ SOLN
10.0000 mg | Freq: Once | INTRAMUSCULAR | Status: AC
  Administered 2023-06-08: 10 mg via INTRAVENOUS
  Filled 2023-06-08: qty 1

## 2023-06-08 MED ORDER — IOHEXOL 350 MG/ML SOLN
100.0000 mL | Freq: Once | INTRAVENOUS | Status: AC | PRN
  Administered 2023-06-08: 75 mL via INTRAVENOUS

## 2023-06-08 MED ORDER — METOCLOPRAMIDE HCL 5 MG/ML IJ SOLN
10.0000 mg | Freq: Once | INTRAMUSCULAR | Status: AC
  Administered 2023-06-08: 10 mg via INTRAVENOUS
  Filled 2023-06-08: qty 2

## 2023-06-08 MED ORDER — SODIUM CHLORIDE (PF) 0.9 % IJ SOLN
INTRAMUSCULAR | Status: AC
  Filled 2023-06-08: qty 50

## 2023-06-08 MED ORDER — SODIUM CHLORIDE 0.9 % IV BOLUS
1000.0000 mL | Freq: Once | INTRAVENOUS | Status: AC
  Administered 2023-06-08: 1000 mL via INTRAVENOUS

## 2023-06-08 MED ORDER — DIPHENHYDRAMINE HCL 50 MG/ML IJ SOLN
12.5000 mg | Freq: Once | INTRAMUSCULAR | Status: AC
  Administered 2023-06-08: 12.5 mg via INTRAVENOUS
  Filled 2023-06-08: qty 1

## 2023-06-08 NOTE — Discharge Instructions (Signed)
 Your lab work and imaging today did not show any significant abnormality.  Please follow-up with your primary care doctor tomorrow.  Please return if you experience any new or worsening symptoms

## 2023-06-08 NOTE — ED Provider Notes (Signed)
 Signout from Editha Goring, PA-C at shift change. Briefly, patient presents for feeling confused earlier this morning she broke on the [facility] sweat and felt nauseous.  Symptoms were worse with head movement.  She laid down because she felt dizzy as if she was going to pass out.  She does report a similar episode of this in August of last year, however that was attributed to the heat and dehydration.  She denies any changes in her vision and speech or gait.  She has no unilateral weakness or numbness.  At time of signout her EKG is sinus rhythm, her lab work is overall very reassuring.  CT head is negative.  CT angio head and neck is pending.    CT angio PE without any acute findings.  Patient reports marked improvement of her symptoms.  She is able to ambulate without any symptoms.  She has no neurodeficits on exam.  She has an appointment with her PCP and feels comfortable going home.  Patient will be discharged.   Most current vital signs reviewed and are as follows: BP 114/73   Pulse 86   Temp 98 F (36.7 C) (Oral)   Resp 11   SpO2 100%   Patient will be discharged home. The patient has been appropriately medically screened and/or stabilized in the ED. I have low suspicion for any other emergent medical condition which would require further screening, evaluation or treatment in the ED or require inpatient management. At time of discharge the patient is hemodynamically stable and in no acute distress. I have discussed work-up results and diagnosis with patient and answered all questions. Patient is agreeable with discharge plan. We discussed strict return precautions for returning to the emergency department and they verbalized understanding.      Felicie Horning, PA-C 06/08/23 1918    Wynetta Heckle, MD 06/11/23 1204

## 2023-06-08 NOTE — ED Provider Notes (Signed)
  EMERGENCY DEPARTMENT AT [facility] Provider Note   CSN: [id number] Arrival date & time: 06/08/23  4782     History  Chief Complaint  Patient presents with   Dizziness   Near Syncope    Kathy Hunt is a 53 y.o. female.  53 year old female presents via EMS from place of employment here with her son and boyfriend.  Patient states that she had her usual morning today, ate waffles for breakfast and went to work.  She was working in [employer] when she felt like things were not making sense at work and she told her boss that she was having a hard time and she sat down.  Patient started to feel like she broke out in a cold sweat and felt very nauseous, the symptoms were worse if she moved her head.  She then proceeded to lay down on the floor because she was afraid she was going to pass out.  She states that she had a similar episode in August of last year but she was out in the heat at that time at a racetrack was evaluated by medics and provided with IV fluids and her symptoms resolved.  She denies recent GI illness or exertional activities.  She denies changes in vision, speech, gait, unilateral weakness or numbness.       Home Medications Prior to Admission medications   Medication Sig Start Date End Date Taking? Authorizing Provider  acetaminophen  (TYLENOL ) 500 MG tablet Take 1,000 mg by mouth every 6 (six) hours as needed for moderate pain. Patient not taking: Reported on 05/01/2021    [provider]  certolizumab pegol  (CIMZIA ) 2 X 200 MG/ML PSKT Inject into the skin.    [provider]  certolizumab pegol  (CIMZIA ) 2 X 200 MG/ML PSKT See admin instructions.    [provider]  ibuprofen  (ADVIL ,MOTRIN ) 800 MG tablet Take 1 tablet (800 mg total) by mouth every 8 (eight) hours as needed for moderate pain or cramping (Take with food.). Patient not taking: Reported on 05/01/2021 12/08/16   Carin Charleston, FNP  leucovorin  (WELLCOVORIN) 5 MG tablet leucovorin calcium 5 mg tablet    [provider]  methocarbamol  (ROBAXIN ) 750 MG tablet Take 1 tablet (750 mg total) by mouth every 8 (eight) hours as needed for muscle spasms. Patient not taking: Reported on 05/01/2021 12/08/16   Carin Charleston, FNP  methotrexate (RHEUMATREX) 2.5 MG tablet Take 2.5 mg by mouth once a week. Caution:Chemotherapy. Protect from light.- take 7 po qweek    [provider]  polyethylene glycol powder (GLYCOLAX /MIRALAX ) powder Take 17 g by mouth daily. 11/04/16   Carin Charleston, FNP  predniSONE  (STERAPRED UNI-PAK 21 TAB) 10 MG (21) TBPK tablet Take by mouth daily. Take 6 tabs by mouth daily  for 2 days, then 5 tabs for 2 days, then 4 tabs for 2 days, then 3 tabs for 2 days, 2 tabs for 2 days, then 1 tab by mouth daily for 2 days 09/10/22   Prosperi, Christian H, PA-C      Allergies    Penicillins    Review of Systems   Review of Systems Negative except as per HPI Physical Exam Updated Vital Signs BP 128/76   Pulse 64   Temp 98 F (36.7 C) (Oral)   Resp 14   SpO2 99%  Physical Exam Vitals and nursing note reviewed.  Constitutional:      General: She is not in acute distress.  Appearance: She is well-developed. She is not diaphoretic.  HENT:     Head: Normocephalic and atraumatic.     Mouth/Throat:     Mouth: Mucous membranes are moist.  Eyes:     Extraocular Movements: Extraocular movements intact.     Pupils: Pupils are equal, round, and reactive to light.  Cardiovascular:     Rate and Rhythm: Normal rate and regular rhythm.     Pulses: Normal pulses.     Heart sounds: Normal heart sounds.  Pulmonary:     Effort: Pulmonary effort is normal.     Breath sounds: Normal breath sounds.  Abdominal:     Palpations: Abdomen is soft.     Tenderness: There is no abdominal tenderness.  Musculoskeletal:     Cervical back: Neck supple.     Right lower leg: No edema.     Left lower leg: No edema.   Skin:    General: Skin is warm and dry.     Findings: No erythema or rash.  Neurological:     Mental Status: She is alert and oriented to person, place, and time.     GCS: GCS eye subscore is 4. GCS verbal subscore is 5. GCS motor subscore is 6.     Cranial Nerves: No cranial nerve deficit or facial asymmetry.     Sensory: Sensation is intact. No sensory deficit.     Motor: Motor function is intact. No weakness, tremor or pronator drift.     Coordination: Finger-Nose-Finger Test and Heel to [employer] normal.  Psychiatric:        Behavior: Behavior normal.     ED Results / Procedures / Treatments   Labs (all labs ordered are listed, but only abnormal results are displayed) Labs Reviewed  CBC - Abnormal; Notable for the following components:      Result Value   MCV 100.5 (*)    All other components within normal limits  URINALYSIS, ROUTINE W REFLEX MICROSCOPIC - Abnormal; Notable for the following components:   Ketones, ur 5 (*)    All other components within normal limits  CBG MONITORING, ED - Abnormal; Notable for the following components:   Glucose-Capillary 101 (*)    All other components within normal limits  COMPREHENSIVE METABOLIC PANEL WITH GFR  TROPONIN I (HIGH SENSITIVITY)  TROPONIN I (HIGH SENSITIVITY)    EKG None  Radiology CT HEAD WO CONTRAST Result Date: 06/08/2023 CLINICAL DATA:  Provided history: Neuro deficit, acute, stroke suspected. Additional history provided: Weakness, nausea, lightheadedness. EXAM: CT HEAD WITHOUT CONTRAST TECHNIQUE: Contiguous axial images were obtained from the base of the skull through the vertex without intravenous contrast. RADIATION DOSE REDUCTION: This exam was performed according to the departmental dose-optimization program which includes automated exposure control, adjustment of the mA and/or kV according to patient size and/or use of iterative reconstruction technique. COMPARISON:  Head CT 05/23/2019. FINDINGS: Brain: Cerebral  volume is normal. Prominent perivascular space within the left basal ganglia inferiorly. There is no acute intracranial hemorrhage. No demarcated cortical infarct. No extra-axial fluid collection. No evidence of an intracranial mass. No midline shift. Vascular: No hyperdense vessel.  Atherosclerotic calcifications. Skull: No calvarial fracture or aggressive osseous lesion. Sinuses/Orbits: No mass or acute finding within the imaged orbits. No significant paranasal sinus disease at the imaged levels. IMPRESSION: No evidence of an acute intracranial abnormality. Electronically Signed   By: Bascom Lily D.O.   On: 06/08/2023 10:49    Procedures Procedures    Medications Ordered in  ED Medications  sodium chloride  0.9 % bolus 1,000 mL (0 mLs Intravenous Stopped 06/08/23 1148)  ondansetron  (ZOFRAN ) injection 4 mg (4 mg Intravenous Given 06/08/23 1408)  metoCLOPramide (REGLAN) injection 10 mg (10 mg Intravenous Given 06/08/23 1433)  dexamethasone (DECADRON) injection 10 mg (10 mg Intravenous Given 06/08/23 1433)  diphenhydrAMINE (BENADRYL) injection 12.5 mg (12.5 mg Intravenous Given 06/08/23 1432)  iohexol (OMNIPAQUE) 350 MG/ML injection 100 mL (75 mLs Intravenous Contrast Given 06/08/23 1443)    ED Course/ Medical Decision Making/ A&P                                 Medical Decision Making Amount and/or Complexity of Data Reviewed Labs: ordered. Radiology: ordered.  Risk Prescription drug management.   This patient presents to the ED for concern of nausea, light headed, this involves an extensive number of treatment options, and is a complaint that carries with it a high risk of complications and morbidity.  The differential diagnosis includes but not limited to migraine, metabolic/electrolyte, arrhythmia, CVA   Co morbidities / Chronic conditions that complicate the patient evaluation  Psoriatic arthritis   Additional history obtained:  Additional history obtained from EMR External records  from outside source obtained and reviewed including history on file   Lab Tests:  I Ordered, and personally interpreted labs.  The pertinent results include: Troponins are flat at -2.  Urinalysis with small ketones.  CBC without significant findings.  CBG normal at 101.  CMP within normals.   Imaging Studies ordered:  I ordered imaging studies including CT head I independently visualized and interpreted imaging which showed no acute abnormality I agree with the radiologist interpretation   Cardiac Monitoring: / EKG:  The patient was maintained on a cardiac monitor.  I personally viewed and interpreted the cardiac monitored which showed an underlying rhythm of: Sinus rhythm, rate 77   Problem List / ED Course / Critical interventions / Medication management  53 year old female presents with complaint as above. On exam, appears to feel unwell although not toxic and in no distress. Work up unrevealing. Patient with positional component to her symptoms today, reproduced with EOM exam. Nauseous while walking in hall to bathroom with family. Discussed with attending who has seen the patient, recommends headache cocktail, CTA head/neck and reassess gait. If symptoms not improved, consider MRI. Care signed out at change on shift pending further work up.  I ordered medication including Zofran  for nausea with some improvement.  Reglan, Decadron, Benadryl for headache Reevaluation of the patient after these medicines showed that the patient improved with Zofran .  Pending reevaluation after migraine cocktail I have reviewed the patients home medicines and have made adjustments as needed   Consultations Obtained:  I requested consultation with the ER attending, Dr. Lewis Red,  and discussed lab and imaging findings as well as pertinent plan - they recommend: CTA head/neck, headache cocktail, if gait unsteady, follow with MRI   Social Determinants of Health:  Lives with son, boyfriend at bedside     Test / Admission - Considered:  Disposition pending at time of signout oncoming provider          Final Clinical Impression(s) / ED Diagnoses Final diagnoses:  None    Rx / DC Orders ED Discharge Orders     None         Darlis Eisenmenger, PA-C 06/08/23 1700    Deatra Face, MD 06/09/23 1452

## 2023-06-08 NOTE — ED Triage Notes (Signed)
 EMS reports from work, Pt feeling weak and laid down in break room, c/o nausea and lightheadedness, after laying down felt like she was going to pass out. On scene EMS reports CBG 76. Pt states similar episodes in the past.  BP 108/58 HR 76 RR 16 Sp02 99 RA CBG 76

## 2023-06-09 DIAGNOSIS — Z79899 Other long term (current) drug therapy: Secondary | ICD-10-CM | POA: Diagnosis not present

## 2023-06-09 DIAGNOSIS — L4050 Arthropathic psoriasis, unspecified: Secondary | ICD-10-CM | POA: Diagnosis not present

## 2023-06-09 DIAGNOSIS — L609 Nail disorder, unspecified: Secondary | ICD-10-CM | POA: Diagnosis not present

## 2023-06-09 DIAGNOSIS — M7989 Other specified soft tissue disorders: Secondary | ICD-10-CM | POA: Diagnosis not present

## 2023-06-24 DIAGNOSIS — E7800 Pure hypercholesterolemia, unspecified: Secondary | ICD-10-CM | POA: Diagnosis not present

## 2023-06-24 DIAGNOSIS — Z79899 Other long term (current) drug therapy: Secondary | ICD-10-CM | POA: Diagnosis not present

## 2023-06-30 DIAGNOSIS — L4050 Arthropathic psoriasis, unspecified: Secondary | ICD-10-CM | POA: Diagnosis not present

## 2023-07-02 DIAGNOSIS — I951 Orthostatic hypotension: Secondary | ICD-10-CM | POA: Diagnosis not present

## 2023-07-02 DIAGNOSIS — R55 Syncope and collapse: Secondary | ICD-10-CM | POA: Diagnosis not present

## 2023-07-06 ENCOUNTER — Ambulatory Visit: Attending: Cardiology | Admitting: Cardiology

## 2023-07-06 ENCOUNTER — Encounter: Payer: Self-pay | Admitting: Cardiology

## 2023-07-06 VITALS — BP 114/80 | HR 80 | Resp 16 | Ht 61.0 in | Wt 176.6 lb

## 2023-07-06 DIAGNOSIS — R072 Precordial pain: Secondary | ICD-10-CM | POA: Diagnosis not present

## 2023-07-06 DIAGNOSIS — R55 Syncope and collapse: Secondary | ICD-10-CM

## 2023-07-06 MED ORDER — METOPROLOL TARTRATE 50 MG PO TABS: 50.0000 mg | ORAL_TABLET | ORAL | 0 refills | Status: AC

## 2023-07-06 NOTE — Patient Instructions (Signed)
 Medication Instructions:  The current medical regimen is effective;  continue present plan and medications.  *If you need a refill on your cardiac medications before your next appointment, please call your pharmacy*   Testing/Procedures: Your physician has requested that you have an echocardiogram. Echocardiography is a painless test that uses sound waves to create images of your heart. It provides your doctor with information about the size and shape of your heart and how well your heart's chambers and valves are working. This procedure takes approximately one hour. There are no restrictions for this procedure. Please do NOT wear cologne, perfume, aftershave, or lotions (deodorant is allowed). Please arrive 15 minutes prior to your appointment time.  Please note: We ask at that you not bring children with you during ultrasound (echo/ vascular) testing. Due to room size and safety concerns, children are not allowed in the ultrasound rooms during exams. Our front office staff cannot provide observation of children in our lobby area while testing is being conducted. An adult accompanying a patient to their appointment will only be allowed in the ultrasound room at the discretion of the ultrasound technician under special circumstances. We apologize for any inconvenience.    Your cardiac CT will be scheduled at one of the below locations:   [facility] [address] [place], [place] [address] [phone]  Or  [facility] [address]  [place], [place] [address]  If scheduled at [facility], please arrive at the [facility] and Children's Entrance (Entrance C2) of [facility] 30 minutes prior to test start time. You can use the FREE valet parking offered at entrance C (encouraged to control the heart rate for the test)  Proceed to the [facility] Radiology Department (first floor) to check-in and test prep.   All radiology patients  and guests should use entrance C2 at [facility], accessed from [facility], even though the hospital's physical address listed is [address]    If scheduled at the Heart and Vascular Tower at [address], please enter the parking lot using the [address] entrance and use the FREE valet service at the patient drop-off area. Enter the buidling and check-in with registration on the main floor.  Please follow these instructions carefully (unless otherwise directed):  An IV will be required for this test and Nitroglycerin will be given.  Hold all erectile dysfunction medications at least 3 days (72 hrs) prior to test. (Ie viagra, cialis, sildenafil, tadalafil, etc)   On the Night Before the Test: Be sure to Drink plenty of water. Do not consume any caffeinated/decaffeinated beverages or chocolate 12 hours prior to your test. Do not take any antihistamines 12 hours prior to your test.  On the Day of the Test: Drink plenty of water until 1 hour prior to the test. Do not eat any food 1 hour prior to test. You may take your regular medications prior to the test.  Take metoprolol (Lopressor) two hours prior to test. If you take Furosemide/Hydrochlorothiazide/Spironolactone/Chlorthalidone, please HOLD on the morning of the test. Patients who wear a continuous glucose monitor MUST remove the device prior to scanning. FEMALES- please wear underwire-free bra if available, avoid dresses & tight clothing      After the Test: Drink plenty of water. After receiving IV contrast, you may experience a mild flushed feeling. This is normal. On occasion, you may experience a mild rash up to 24 hours after the test.  This is not dangerous. If this occurs, you can take Benadryl  25 mg, Zyrtec, Claritin, or Allegra and increase your fluid intake. (Patients taking Tikosyn should avoid Benadryl , and may take Zyrtec, Claritin, or Allegra) If you experience trouble breathing,  this can be serious. If it is severe call 911 IMMEDIATELY. If it is mild, please call our office.  We will call to schedule your test 2-4 weeks out understanding that some insurance companies will need an authorization prior to the service being performed.   For more information and frequently asked questions, please visit our website : [email or web address]  For non-scheduling related questions, please contact the cardiac imaging nurse navigator should you have any questions/concerns: Cardiac Imaging Nurse Navigators Direct Office Dial: [phone]   For scheduling needs, including cancellations and rescheduling, please call [place], [phone].  ZIO XT- Long Term Monitor Instructions  Your physician has requested you wear a ZIO patch monitor for 14 days.  This is a single patch monitor. [employer] supplies one patch monitor per enrollment. Additional stickers are not available. Please do not apply patch if you will be having a Nuclear Stress Test,  Echocardiogram, Cardiac CT, MRI, or Chest Xray during the period you would be wearing the  monitor. The patch cannot be worn during these tests. You cannot remove and re-apply the  ZIO XT patch monitor.  Your ZIO patch monitor will be mailed 3 day USPS to your address on file. It may take 3-5 days  to receive your monitor after you have been enrolled.  Once you have received your monitor, please review the enclosed instructions. Your monitor  has already been registered assigning a specific monitor serial # to you.  Billing and Patient Assistance Program Information  We have supplied [employer] with any of your insurance information on file for billing purposes. [employer] offers a sliding scale Patient Assistance Program for patients that do not have  insurance, or whose insurance does not completely cover the cost of the ZIO monitor.  You must apply for the Patient Assistance Program to qualify for this discounted rate.  To apply,  please call [employer] at [phone], select option 4, select option 2, ask to apply for  Patient Assistance Program. M[place]eth will ask your household income, and how many people  are in your household. They will quote your out-of-pocket cost based on that information.  [employer] will also be able to set up a 80-month, interest-free payment plan if needed.  Applying the monitor   Shave hair from upper left chest.  Hold abrader disc by orange tab. Rub abrader in 40 strokes over the upper left chest as  indicated in your monitor instructions.  Clean area with 4 enclosed alcohol pads. Let dry.  Apply patch as indicated in monitor instructions. Patch will be placed under collarbone on left  side of chest with arrow pointing upward.  Rub patch adhesive wings for 2 minutes. Remove white label marked 1. Remove the white  label marked 2. Rub patch adhesive wings for 2 additional minutes.  While looking in a mirror, press and release button in center of patch. A small green light will  flash 3-4 times. This will be your only indicator that the monitor has been turned on.  Do not shower for the first 24 hours. You may shower after the first 24 hours.  Press the button if you feel a symptom. You will hear a small click. Record Date, Time and  Symptom in the Patient Logbook.  When you are ready  to remove the patch, follow instructions on the last 2 pages of Patient  Logbook. Stick patch monitor onto the last page of Patient Logbook.  Place Patient Logbook in the blue and white box. Use locking tab on box and tape box closed  securely. The blue and white box has prepaid postage on it. Please place it in the mailbox as  soon as possible. Your physician should have your test results approximately 7 days after the  monitor has been mailed back to [facility].  Call [facility] Customer Care at [phone] if you have questions regarding  your ZIO XT patch monitor. Call them immediately if you see  an orange light blinking on your  monitor.  If your monitor falls off in less than 4 days, contact our Monitor department at [phone].  If your monitor becomes loose or falls off after 4 days call [employer] at [phone] for  suggestions on securing your monitor  Follow-Up: At [facility], you and your health needs are our priority.  As part of our continuing mission to provide you with exceptional heart care, our providers are all part of one team.  This team includes your primary Cardiologist (physician) and Advanced Practice Providers or APPs (Physician Assistants and Nurse Practitioners) who all work together to provide you with the care you need, when you need it.  Your next appointment:   Follow up will be based on the results of the above testing.   We recommend signing up for the patient portal called MyChart.  Sign up information is provided on this After Visit Summary.  MyChart is used to connect with patients for Virtual Visits (Telemedicine).  Patients are able to view lab/test results, encounter notes, upcoming appointments, etc.  Non-urgent messages can be sent to your provider as well.   To learn more about what you can do with MyChart, go to [email or web address].

## 2023-07-06 NOTE — Progress Notes (Signed)
 Cardiology Office Note:  .   Date:  07/06/2023  ID:  Kathy Hunt, DOB 06-22-1970, MRN [id number] PCP: Gerome Brunet, DO  [facility] Providers Cardiologist:  None    History of Present Illness: Kathy   JOELEEN Hunt is a 53 y.o. female Discussed the use of AI scribe software for clinical note transcription with the patient, who gave verbal consent to proceed.  History of Present Illness Kathy Hunt is a 53 year old female who presents with syncope for evaluation. She was referred by her primary doctor for evaluation of syncope and low blood pressure.  She experienced an episode of syncope on June 08, 2023, characterized by confusion, cold sweats, and clamminess. Symptoms worsened with head movement, leading to dizziness and a sensation of impending fainting. A similar episode occurred in August, attributed to heat and dehydration. During a recent episode at a racetrack, she felt lightheaded and passed out after standing up. Another episode at work involved dizziness and lightheadedness, resulting in a fall. Coworkers noted she appeared gray and blue, prompting a 911 call.  In the emergency department, a CT of the head and neck showed no large vessel occlusion or high-grade stenosis. Her blood pressure was recorded at 114/73 mmHg. An EKG showed a heart rate of 77 beats per minute with sinus rhythm, with no changes from prior EKGs. A coronary calcium score from May 13, 2022, was 4.3, placing her in the 86th percentile.  Her primary doctor noted her blood pressure drops significantly upon standing. She denies dehydration, as confirmed by urine tests, and reports being a regular water drinker.  She experiences chest pressure at times, which can feel restrictive, and sometimes experiences palpitations that make her cough. She has a family history of heart disease, with her father having had a heart attack and her mother a massive stroke. Her sister is on beta blockers.      ROS: No  fever chills Studies Reviewed: .        Results LABS LDL: 105 A1c: 5.2 Hb: 13.6 Cr: 0.6  RADIOLOGY CT angiogram of the head and neck: No large vessel occlusion, no high grade stenosis (06/08/2023) personally reviewed and interpreted Coronary calcium score: 4.3, 86th percentile (05/13/2022)  DIAGNOSTIC EKG: Heart rate 77 bpm, sinus rhythm, no change from prior (06/08/2023) Risk Assessment/Calculations:            Physical Exam:   VS:  BP 114/80 (BP Location: Left Arm, Patient Position: Sitting)   Pulse 80   Resp 16   Ht 5' 1 (1.549 m)   Wt 176 lb 9.6 oz (80.1 kg)   SpO2 94%   BMI 33.37 kg/m    Wt Readings from Last 3 Encounters:  07/06/23 176 lb 9.6 oz (80.1 kg)  09/10/22 167 lb (75.8 kg)  04/08/17 144 lb 8 oz (65.5 kg)    GEN: Well nourished, well developed in no acute distress NECK: No JVD; No carotid bruits CARDIAC: RRR, no murmurs, no rubs, no gallops RESPIRATORY:  Clear to auscultation without rales, wheezing or rhonchi  ABDOMEN: Soft, non-tender, non-distended EXTREMITIES:  No edema; No deformity   ASSESSMENT AND PLAN: .    Assessment and Plan Assessment & Plan Vasovagal syncope Recurrent episodes of syncope characterized by dizziness, lightheadedness, and cold sweats, consistent with vasovagal syncope, potentially exacerbated by orthostatic hypotension. Differential includes autonomic dysfunction or dysautonomia. No evidence of dehydration. No evidence of large vessel occlusion or high-grade stenosis on recent CT of head and neck.  EKG shows sinus rhythm with no changes from prior. Family history of cardiac issues. - Order Zio monitor to assess for arrhythmias. - Educate on vasovagal syncope and management strategies, including laying down with legs elevated during prodrome. - Advise increased salt intake and hydration with liquid IV to manage blood pressure. - Consider support stockings if standing for prolonged periods.  Orthostatic hypotension/vasovagal  syncope Episodes of dizziness and syncope associated with changes in posture, confirmed by orthostatic blood pressure measurements showing significant drop. Likely contributing to vasovagal syncope episodes. - Advise increased salt intake and hydration with liquid IV to manage blood pressure. - Educate on management strategies, including laying down with legs elevated during prodrome.  Chest pain Intermittent chest pressure and pain, sometimes associated with palpitations. No acute findings on recent CT angiogram of head and neck. Coronary calcium score low at 4.3. Family history of heart disease. Differential includes coronary artery disease or other cardiac issues. - Order echocardiogram to assess heart function. - Order coronary CT scan to evaluate coronary arteries for stenosis or other abnormalities.          Dispo: We will follow-up with results of testing  Signed, Oneil Parchment, MD

## 2023-07-07 ENCOUNTER — Ambulatory Visit: Attending: Cardiology

## 2023-07-07 DIAGNOSIS — R55 Syncope and collapse: Secondary | ICD-10-CM

## 2023-07-07 NOTE — Progress Notes (Unsigned)
 Enrolled patient for a 14 day Zio XT  monitor to be mailed to patients home

## 2023-07-17 ENCOUNTER — Encounter (HOSPITAL_COMMUNITY): Payer: Self-pay

## 2023-07-20 ENCOUNTER — Telehealth (HOSPITAL_COMMUNITY): Payer: Self-pay | Admitting: Emergency Medicine

## 2023-07-20 NOTE — Telephone Encounter (Signed)
 Reaching out to patient to offer assistance regarding upcoming cardiac imaging study; pt verbalizes understanding of appt date/time, parking situation and where to check in, pre-test NPO status and medications ordered, and verified current allergies; name and call back number provided for further questions should they arise Kathy Alexandria RN Navigator Cardiac Imaging [facility] [phone] office [phone] cell

## 2023-07-21 ENCOUNTER — Ambulatory Visit (HOSPITAL_COMMUNITY)
Admission: RE | Admit: 2023-07-21 | Discharge: 2023-07-21 | Disposition: A | Discharge status: Home / Self Care | Source: Ambulatory Visit | Attending: Internal Medicine | Admitting: Internal Medicine

## 2023-07-21 DIAGNOSIS — I2510 Atherosclerotic heart disease of native coronary artery without angina pectoris: Secondary | ICD-10-CM | POA: Insufficient documentation

## 2023-07-21 DIAGNOSIS — R072 Precordial pain: Secondary | ICD-10-CM | POA: Diagnosis not present

## 2023-07-21 DIAGNOSIS — R55 Syncope and collapse: Secondary | ICD-10-CM | POA: Diagnosis not present

## 2023-07-21 MED ORDER — IOHEXOL 350 MG/ML SOLN
100.0000 mL | Freq: Once | INTRAVENOUS | Status: AC | PRN
  Administered 2023-07-21: 100 mL via INTRAVENOUS

## 2023-07-21 MED ORDER — NITROGLYCERIN 0.4 MG SL SUBL
0.8000 mg | SUBLINGUAL_TABLET | Freq: Once | SUBLINGUAL | Status: AC
  Administered 2023-07-21: 0.8 mg via SUBLINGUAL

## 2023-07-22 ENCOUNTER — Ambulatory Visit: Payer: Self-pay | Admitting: Cardiology

## 2023-07-22 DIAGNOSIS — I2510 Atherosclerotic heart disease of native coronary artery without angina pectoris: Secondary | ICD-10-CM

## 2023-07-28 DIAGNOSIS — L4050 Arthropathic psoriasis, unspecified: Secondary | ICD-10-CM | POA: Diagnosis not present

## 2023-07-31 NOTE — Telephone Encounter (Addendum)
 Left a detailed message advising the patient that its ok for her to have her lipid panel at her rheumatologist appt. Advised a call back with any questions or concerns.   Lab order placed.

## 2023-08-18 ENCOUNTER — Ambulatory Visit (HOSPITAL_COMMUNITY)
Admission: RE | Admit: 2023-08-18 | Discharge: 2023-08-18 | Disposition: A | Discharge status: Home / Self Care | Source: Ambulatory Visit | Attending: Cardiology | Admitting: Cardiology

## 2023-08-18 DIAGNOSIS — R55 Syncope and collapse: Secondary | ICD-10-CM | POA: Insufficient documentation

## 2023-08-18 DIAGNOSIS — R072 Precordial pain: Secondary | ICD-10-CM | POA: Insufficient documentation

## 2023-08-18 LAB — ECHOCARDIOGRAM COMPLETE
Area-P 1/2: 3.42 cm2
S' Lateral: 2.8 cm

## 2023-08-21 DIAGNOSIS — R55 Syncope and collapse: Secondary | ICD-10-CM | POA: Diagnosis not present

## 2023-08-25 DIAGNOSIS — L4050 Arthropathic psoriasis, unspecified: Secondary | ICD-10-CM | POA: Diagnosis not present

## 2023-08-27 DIAGNOSIS — Z1231 Encounter for screening mammogram for malignant neoplasm of breast: Secondary | ICD-10-CM | POA: Diagnosis not present

## 2023-09-22 DIAGNOSIS — L4050 Arthropathic psoriasis, unspecified: Secondary | ICD-10-CM | POA: Diagnosis not present

## 2023-11-17 DIAGNOSIS — L4050 Arthropathic psoriasis, unspecified: Secondary | ICD-10-CM | POA: Diagnosis not present
